# Patient Record
Sex: Male | Born: 1945 | Race: White | Hispanic: No | Smoking: Current every day smoker
Health system: Southern US, Community
[De-identification: ages and names within clinical notes are randomized; demographics above are authoritative.]

## PROBLEM LIST (undated history)

## (undated) DIAGNOSIS — J449 Chronic obstructive pulmonary disease, unspecified: Secondary | ICD-10-CM

## (undated) DIAGNOSIS — S21139A Puncture wound without foreign body of unspecified front wall of thorax without penetration into thoracic cavity, initial encounter: Secondary | ICD-10-CM

## (undated) DIAGNOSIS — I10 Essential (primary) hypertension: Secondary | ICD-10-CM

## (undated) HISTORY — DX: Essential (primary) hypertension: I10

---

## 2006-04-13 ENCOUNTER — Other Ambulatory Visit: Payer: Self-pay

## 2006-04-14 ENCOUNTER — Inpatient Hospital Stay: Payer: Self-pay | Admitting: Internal Medicine

## 2007-05-11 ENCOUNTER — Ambulatory Visit: Payer: Self-pay | Admitting: Internal Medicine

## 2008-09-14 IMAGING — US SCREENING ULTRASOUND OF ABDOMINAL AORTA
1 series · 17 of 17 positions shown · non-contrast
Comparison: none

REASON FOR EXAM: Family Hx AAA
COMMENTS:

[Series 1: screening ultrasound of abdominal aorta · 17 of 17 slices shown]
[im 1/17]
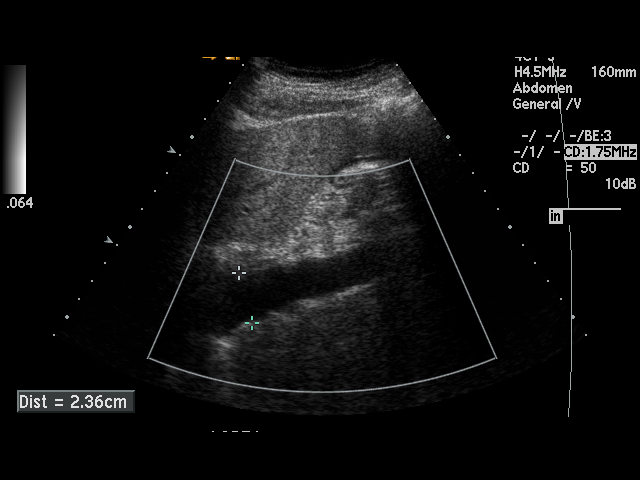
[im 2/17]
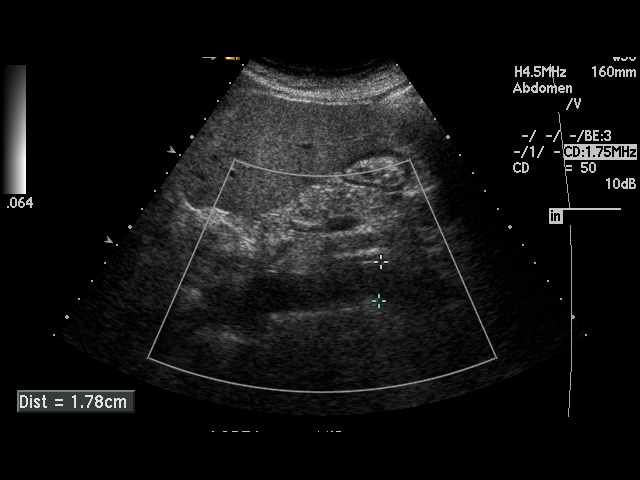
[im 3/17]
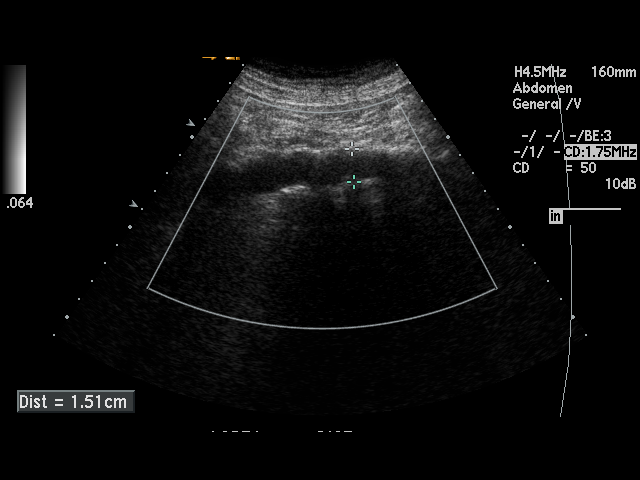
[im 4/17]
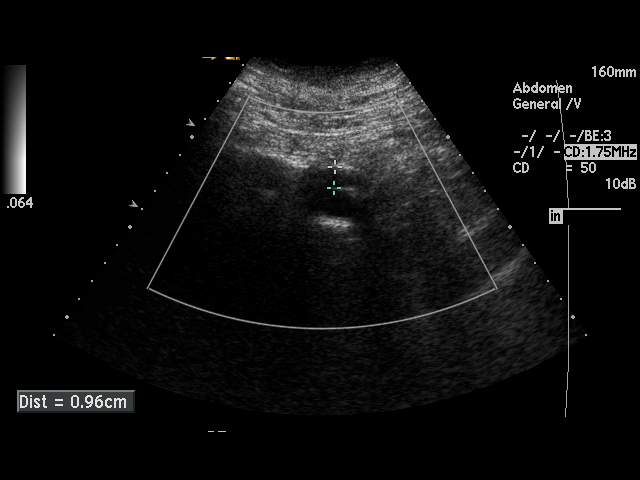
[im 5/17]
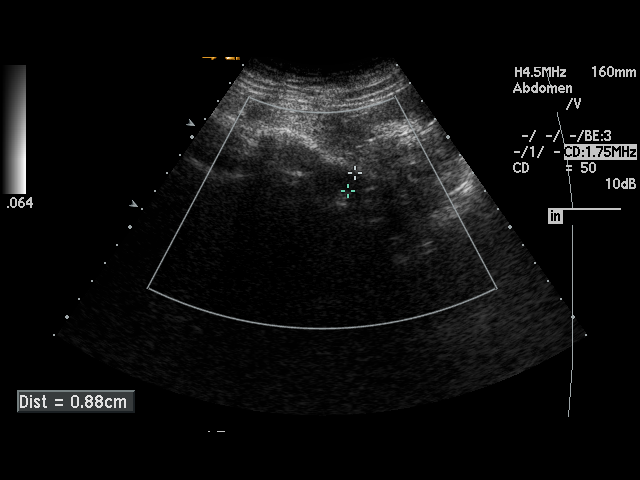
[im 6/17]
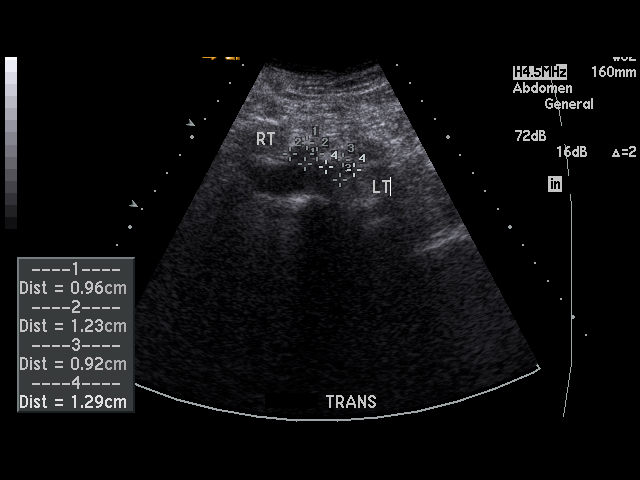
[im 7/17]
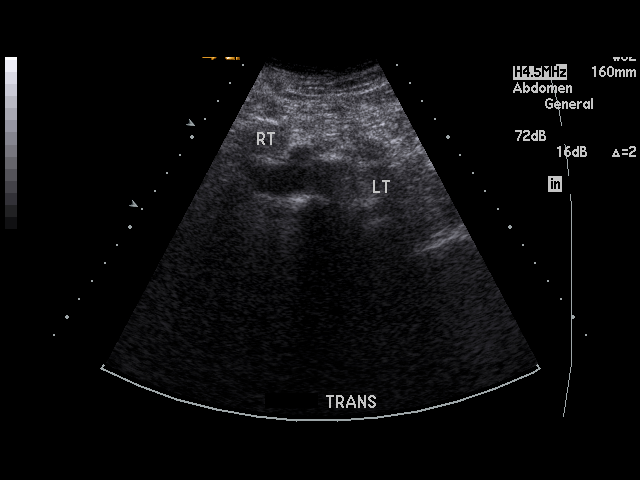
[im 8/17]
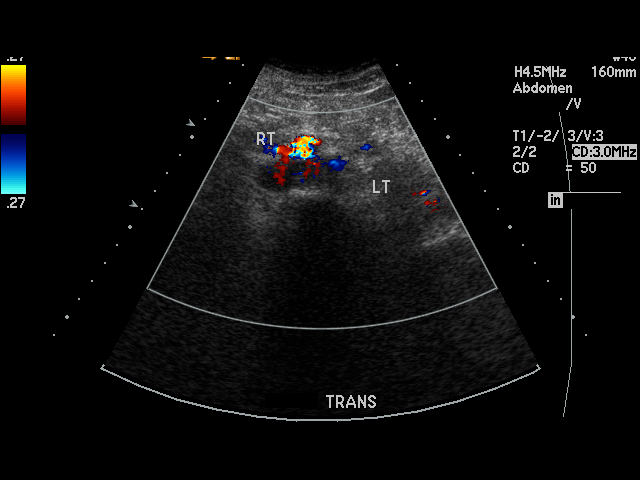
[im 9/17]
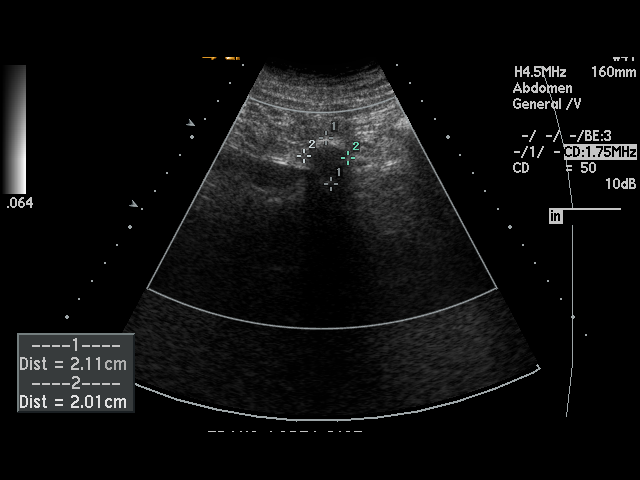
[im 10/17]
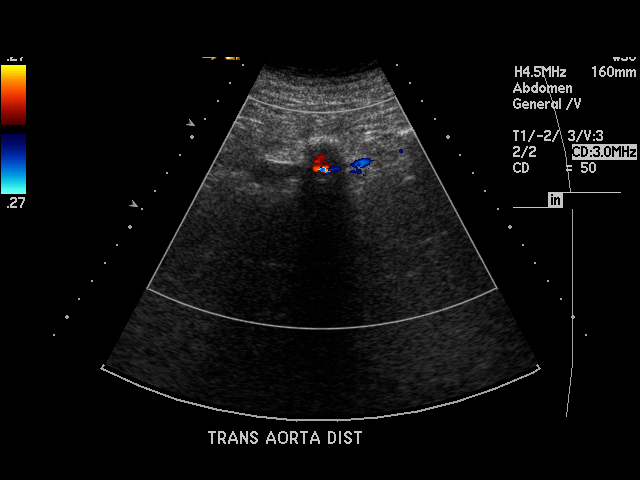
[im 11/17]
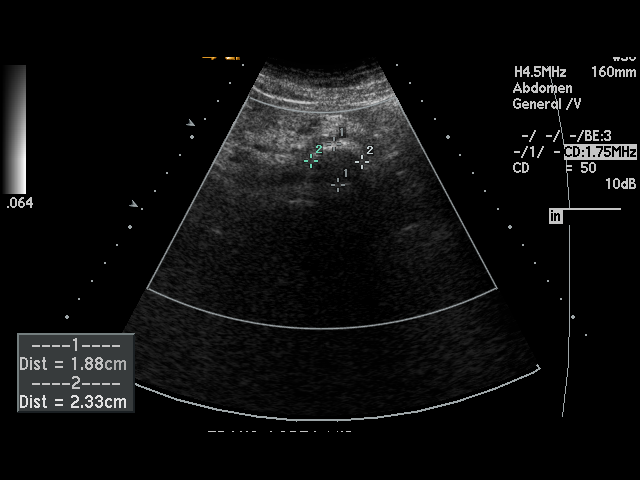
[im 12/17]
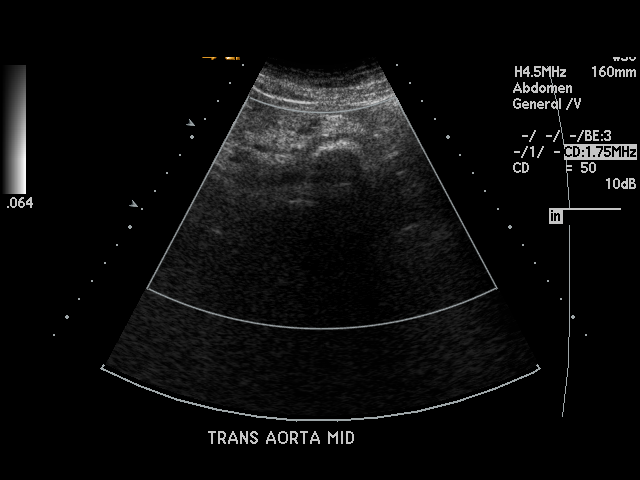
[im 13/17]
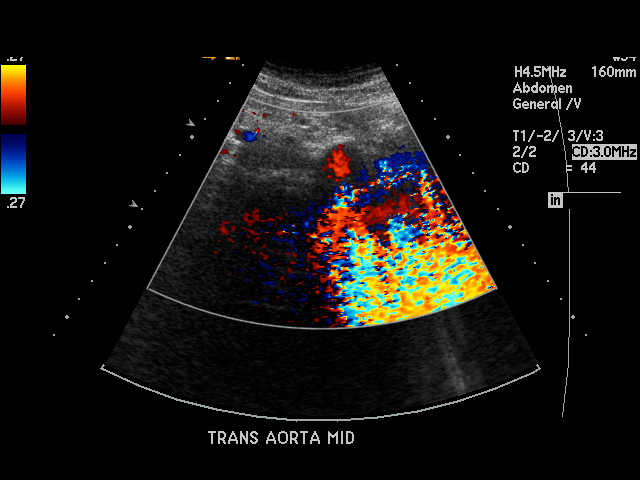
[im 14/17]
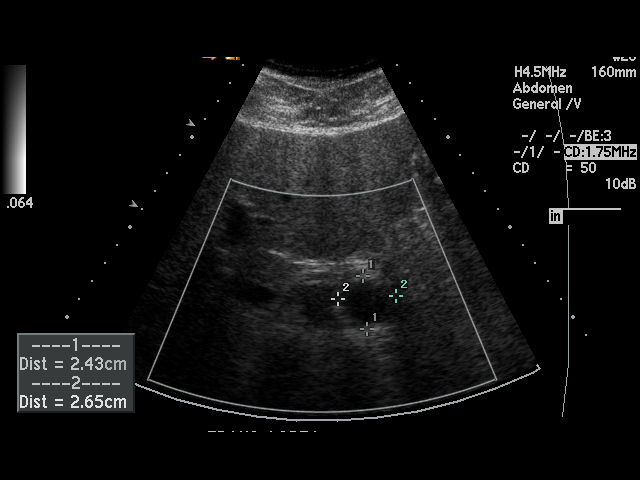
[im 15/17]
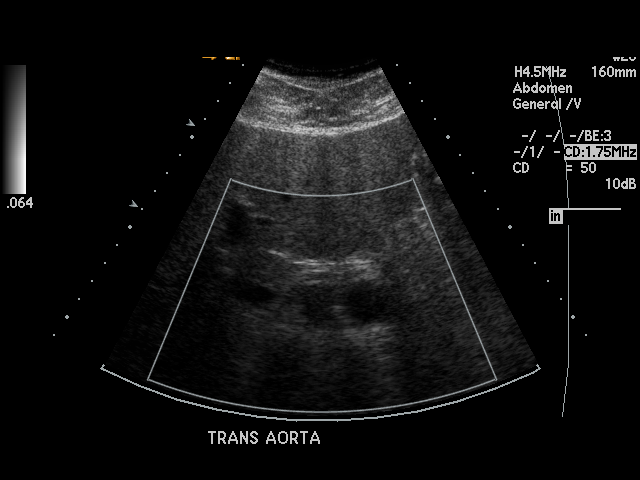
[im 16/17]
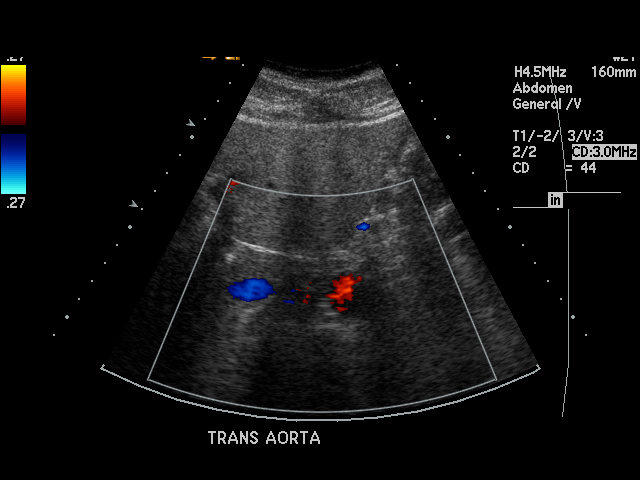
[im 17/17]
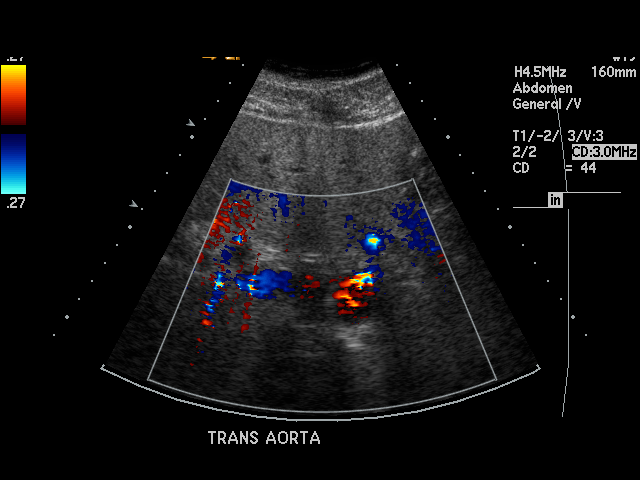

[17 of 17 positions shown; findings below may reference images not displayed]

PROCEDURE:     US  - US EXAM AAA SCREENING  - May 11, 2007  [DATE]

RESULT:     The abdominal aorta is visualized and shows no significant
abnormalities. No aneurysm formation or stenosis is seen. The common iliac
arteries are visualized and are normal in appearance. The distal abdominal
aorta measures 1.51 cm in AP diameter.
IMPRESSION: 1.     No significant abnormalities are noted.

## 2013-11-02 ENCOUNTER — Ambulatory Visit (INDEPENDENT_AMBULATORY_CARE_PROVIDER_SITE_OTHER): Payer: Medicare Other | Admitting: Podiatry

## 2013-11-02 ENCOUNTER — Encounter: Payer: Self-pay | Admitting: Podiatry

## 2013-11-02 VITALS — BP 157/87 | HR 98 | Resp 19 | Ht 70.5 in | Wt 180.0 lb

## 2013-11-02 DIAGNOSIS — B079 Viral wart, unspecified: Secondary | ICD-10-CM

## 2013-11-02 DIAGNOSIS — T691XXA Chilblains, initial encounter: Secondary | ICD-10-CM

## 2013-11-02 DIAGNOSIS — I7300 Raynaud's syndrome without gangrene: Secondary | ICD-10-CM

## 2013-11-02 NOTE — Progress Notes (Signed)
   Subjective:    Patient ID: Dennis Fuller, male    DOB: 11/26/1945, 68 y.o.   MRN: [id number]  HPI Comments: Two issue one i have this plantars wart on my left heel it has been there about a month or month and half.  The other problem which noone can figure out is my toes. Every winter they get red , blistery looking and sometimes painful. Seen two other podiatrist , my primary doctor and also been to [facility]. Its been going on about 3 years now   Toe Pain       Review of Systems  All other systems reviewed and are negative.       Objective:   Physical Exam I have reviewed his past medical history medications allergies surgeries and social history. Vital signs are stable he is alert and oriented x3. Pulses are strongly palpable. Neurologic sensorium is intact per since once the monofilament. Deep tendon reflexes are intact bilateral. Muscle strength is 5 over 5 dorsiflexors plantar flexors inverters everters all intrinsic musculature is intact. Orthopedic evaluation Mr. is all joints distal to the ankle a full range of motion without crepitus. Cutaneous evaluation demonstrates supple well hydrated cutis. The tips of the toes are multiple her as in chilblains and Raynaud's disease. 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 He has a verrucoid lesion to the plantar medial aspect of the left heel. Skin lines circumvent the lesion and thrombosed capillaries are visible.        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 Warts plantar aspect left foot.  Plan: Discussed etiology pathology conservative versus surgical therapies. Suggested that he talk to his primary care doctor about calcium channel blockers. I also suggested that he followup with me in one week after we performed his surgical curettage to the wart his left heel. This is performed with local anesthetic under sterile environment he tolerated procedure well without complication.  He was her soaking twice daily instructions were given I will followup with him in one week

## 2013-11-02 NOTE — Patient Instructions (Signed)

## 2013-11-08 ENCOUNTER — Encounter: Payer: Self-pay | Admitting: Podiatry

## 2013-11-08 ENCOUNTER — Ambulatory Visit: Payer: Self-pay | Admitting: Podiatry

## 2013-11-09 ENCOUNTER — Encounter: Payer: Self-pay | Admitting: Podiatry

## 2013-11-09 ENCOUNTER — Ambulatory Visit (INDEPENDENT_AMBULATORY_CARE_PROVIDER_SITE_OTHER): Payer: Medicare Other | Admitting: Podiatry

## 2013-11-09 VITALS — BP 158/74 | HR 103 | Resp 16 | Ht 70.0 in | Wt 175.0 lb

## 2013-11-09 DIAGNOSIS — Z9889 Other specified postprocedural states: Secondary | ICD-10-CM

## 2013-11-09 DIAGNOSIS — B079 Viral wart, unspecified: Secondary | ICD-10-CM

## 2013-11-09 NOTE — Progress Notes (Signed)
He presents today for followup of excision wart plantar medial aspect of his left heel. He denies fever chills nausea vomiting muscle aches and pains. He continues to soak in Betadine warm water.  Objective: Vital signs are stable he is alert and oriented x3 pulses to the left foot are strongly palpable. He has slight maceration around the surgical site more than likely do to keeping a Band-Aid onto long for applying too much Vaseline. He does not appear to be clinically infected at this time.  Assessment: Well-healing surgical foot left.  Plan: Discontinue Betadine in warm water. Start with Epsom salts and water. Apply very small amount of Vaseline to the surgical site and cover during the day. Leave open to dry at night. I will followup with him in the near future should he develop signs and symptoms of infection. We discussed this with him in great detail today.

## 2014-02-01 ENCOUNTER — Ambulatory Visit (INDEPENDENT_AMBULATORY_CARE_PROVIDER_SITE_OTHER): Payer: Medicare Other

## 2014-02-01 ENCOUNTER — Ambulatory Visit (INDEPENDENT_AMBULATORY_CARE_PROVIDER_SITE_OTHER): Payer: Medicare Other | Admitting: Podiatrist

## 2014-02-01 VITALS — BP 106/57 | HR 107 | Resp 16

## 2014-02-01 DIAGNOSIS — S92309A Fracture of unspecified metatarsal bone(s), unspecified foot, initial encounter for closed fracture: Secondary | ICD-10-CM

## 2014-02-01 DIAGNOSIS — M779 Enthesopathy, unspecified: Secondary | ICD-10-CM

## 2014-02-01 NOTE — Progress Notes (Signed)
   Chief Complaint  Patient presents with  . Follow-up    2 weeks ago i felt a pop in my left foot on the outer edge     HPI: Patient is 68 y.o. male who presents today for pain on the left foot. He states that 2 weeks ago he felt a pop when he went to stand up on his foot. He was wearing boots at the time and doesn't know why his foot pop.     Physical Exam GENERAL APPEARANCE: Alert, conversant. Appropriately groomed. No acute distress.  VASCULAR: Pedal pulses palpable at 2/4 DP and PT bilateral.  Capillary refill time is immediate to all digits,  Proximal to distal cooling it warm to warm.  Digital hair growth is present bilateral  NEUROLOGIC: sensation is intact epicritically and protectively to 5.07 monofilament at 5/5 sites bilateral.  Light touch is intact bilateral, vibratory sensation intact bilateral, achilles tendon reflex is intact bilateral.  MUSCULOSKELETAL: Pain allow patient on the fourth metatarsal base left is noted. Otherwise acceptable muscle strength, tone and stability bilateral.  Intrinsic muscluature intact bilateral.  Rectus appearance of foot and digits noted bilateral.   DERMATOLOGIC: skin color, texture, and turger are within normal limits.  No preulcerative lesions are seen, no interdigital maceration noted.  No open lesions present.  Digital nails are asymptomatic.   X-rays show fracture at the base of the fourth metatarsal seen on the AP view. Assessment: Fracture fourth metatarsal left  Plan: Recommended an air fracture walker and the patient would like to try his own boot as he states he has a very solid and stable hiking type boot which she would like to try first.  He was shown a boot and will pick this up if he needs it. It is not improving with issue.

## 2014-02-01 NOTE — Patient Instructions (Signed)
Metatarsal Stress Fracture A stress fracture is a break in a bone of the body that is caused by repeated stress (trauma) that slowly weakens the bone until it eventually breaks. The metatarsal bones are in the middle of the feet, connecting the toes to the ankle. They are vulnerable to stress fractures. Metatarsal stress fractures are the second most common type of stress fracture in athletes. The metatarsal of the pointer toe (second metatarsal) is the most common metatarsal to suffer a stress fracture. SYMPTOMS   Vague, spread out pain or ache. Sometimes, tenderness and swelling in the foot.  Uncommonly, bleeding and bruising in the foot.  Weakness and inability to bear weight on the injured foot.  Paleness and deformity (sometimes). CAUSES  A stress fracture is caused by repeated trauma. This slowly weakens the bone, faster than it can heal itself, until the bone breaks. Stress fractures often follow a sudden change in training schedule. Stress fractures may be related to the loss of menstrual period in women.  RISK INCREASES WITH:   Previous stress fracture.  Sudden changes in training intensity, frequency, or duration [employer] data [employer] recruits, distance runners).  Bony abnormalities (osteoporosis, tumors).  Metabolism disorders or hormone problems.  Nutrition deficiencies or eating disorders (anorexia or bulimia).  The loss of or irregular menstrual periods in women.  Poor strength and flexibility.  Running on hard surfaces. Poor leg and foot alignment. This includes flat feet.  Poor footwear with poor shock absorbers.  Poor running technique. PREVENTION   Warm up and stretch properly before activity.  Maintain physical fitness:  Muscle strength.  Endurance and flexibility.  Wear proper and correctly fitted footwear. Replace shoes after 300 to 500 miles of running.  Learn and use proper technique with training and activity.  Increase activity and training  gradually.  Treat hormonal disorders. Birth control pills can be helpful for women with menstrual period irregularity.  Correct metabolism and nutrition disorders.  Wear cushioned arch supports for runners with flat feet. PROGNOSIS  With proper treatment, stress fractures usually heal within 6 to 12 weeks. RELATED COMPLICATIONS   Failure to heal (nonunion), especially with stress fractures of the outer foot (upper part of the fifth metatarsal).  Healing in a poor position (malunion).  Recurring stress fracture.  Progression to a complete or displaced fracture.  Risks of surgery: infection, bleeding, injury to nerves (numbness, weakness, paralysis), and need for further surgery.  Repeated stress fracture, not necessarily at the same site. (Occurs in 1 of every 10 patients). TREATMENT Treatment first involves ice and medicine to reduce pain and inflammation. You must rest from any aggravating activity, to avoid making the fracture worse. For severe stress fractures, crutches may be advised, to take weight off the injured foot. Depending on your caregiver's instructions, you may be permitted to perform activities that do not cause pain. Any menstrual, hormonal, or nutritional problems must be addressed and treated. Return to activity must be performed gradually, to avoid reinjuring the foot. Physical therapy may be advised, to help strengthen the foot and regain full function. On rare occasions, surgery is needed. This may be offered if non-surgical treatment is ineffective after 3 to 6 months. MEDICATION   If pain medicine is needed, nonsteroidal anti-inflammatory medicines (NSAIDS) or other minor pain relievers are often advised.  Do not take pain medicine for 7 days before surgery.  Only take over-the-counter or prescription medicines for pain, discomfort, or fever as directed by your caregiver. SEEK IMMEDIATE MEDICAL CARE IF:  Symptoms get  worse or do not improve in 2 weeks, despite  treatment. The following occur after immobilization or surgery:  Swelling above or below the fracture site.  Severe, persistent pain.  Blue or gray skin below the fracture site, especially under the toenails. Numbness or loss of feeling below the fracture site.  New, unexplained symptoms develop. (Drugs used in treatment may produce side effects.) Document Released: 08/24/2005 Document Revised: 11/16/2011 Document Reviewed: 12/06/2008 [facility] Patient Information 2014 [place], [place].

## 2015-09-11 DIAGNOSIS — M9903 Segmental and somatic dysfunction of lumbar region: Secondary | ICD-10-CM | POA: Diagnosis not present

## 2015-09-11 DIAGNOSIS — M5136 Other intervertebral disc degeneration, lumbar region: Secondary | ICD-10-CM | POA: Diagnosis not present

## 2015-09-11 DIAGNOSIS — M9905 Segmental and somatic dysfunction of pelvic region: Secondary | ICD-10-CM | POA: Diagnosis not present

## 2015-09-11 DIAGNOSIS — M546 Pain in thoracic spine: Secondary | ICD-10-CM | POA: Diagnosis not present

## 2015-09-11 DIAGNOSIS — M9902 Segmental and somatic dysfunction of thoracic region: Secondary | ICD-10-CM | POA: Diagnosis not present

## 2015-09-11 DIAGNOSIS — M955 Acquired deformity of pelvis: Secondary | ICD-10-CM | POA: Diagnosis not present

## 2017-09-28 DIAGNOSIS — L853 Xerosis cutis: Secondary | ICD-10-CM | POA: Diagnosis not present

## 2018-12-29 DIAGNOSIS — R590 Localized enlarged lymph nodes: Secondary | ICD-10-CM | POA: Diagnosis not present

## 2022-05-25 ENCOUNTER — Other Ambulatory Visit: Payer: Self-pay | Admitting: Surgery

## 2022-05-25 DIAGNOSIS — M19011 Primary osteoarthritis, right shoulder: Secondary | ICD-10-CM

## 2022-06-02 ENCOUNTER — Ambulatory Visit
Admission: RE | Admit: 2022-06-02 | Discharge: 2022-06-02 | Disposition: A | Discharge status: Home / Self Care | Payer: Medicare Other | Source: Ambulatory Visit | Attending: Surgery | Admitting: Surgery

## 2022-06-02 DIAGNOSIS — M19011 Primary osteoarthritis, right shoulder: Secondary | ICD-10-CM | POA: Diagnosis not present

## 2023-03-25 ENCOUNTER — Other Ambulatory Visit: Payer: Self-pay | Admitting: Surgery

## 2023-03-25 DIAGNOSIS — M19011 Primary osteoarthritis, right shoulder: Secondary | ICD-10-CM

## 2023-03-25 DIAGNOSIS — M7581 Other shoulder lesions, right shoulder: Secondary | ICD-10-CM

## 2023-04-07 ENCOUNTER — Ambulatory Visit
Admission: RE | Admit: 2023-04-07 | Discharge: 2023-04-07 | Disposition: A | Discharge status: Home / Self Care | Payer: Medicare Other | Source: Ambulatory Visit | Attending: Surgery | Admitting: Surgery

## 2023-04-07 DIAGNOSIS — M19011 Primary osteoarthritis, right shoulder: Secondary | ICD-10-CM | POA: Diagnosis present

## 2023-04-07 DIAGNOSIS — M7581 Other shoulder lesions, right shoulder: Secondary | ICD-10-CM | POA: Insufficient documentation

## 2023-05-13 ENCOUNTER — Other Ambulatory Visit: Payer: Self-pay | Admitting: Surgery

## 2023-05-14 ENCOUNTER — Encounter
Admission: RE | Admit: 2023-05-14 | Discharge: 2023-05-14 | Disposition: A | Discharge status: Home / Self Care | Payer: No Typology Code available for payment source | Source: Ambulatory Visit | Attending: Surgery | Admitting: Surgery

## 2023-05-14 ENCOUNTER — Other Ambulatory Visit: Payer: Self-pay

## 2023-05-14 VITALS — BP 120/60 | HR 86 | Resp 16 | Ht 69.0 in | Wt 156.0 lb

## 2023-05-14 DIAGNOSIS — Z01810 Encounter for preprocedural cardiovascular examination: Secondary | ICD-10-CM | POA: Diagnosis not present

## 2023-05-14 DIAGNOSIS — Z01818 Encounter for other preprocedural examination: Secondary | ICD-10-CM | POA: Diagnosis present

## 2023-05-14 HISTORY — DX: Chronic obstructive pulmonary disease, unspecified: J44.9

## 2023-05-14 LAB — COMPREHENSIVE METABOLIC PANEL
ALT: 17 U/L (ref 0–44)
AST: 21 U/L (ref 15–41)
Albumin: 4.5 g/dL (ref 3.5–5.0)
Alkaline Phosphatase: 43 U/L (ref 38–126)
Anion gap: 11 (ref 5–15)
BUN: 15 mg/dL (ref 8–23)
CO2: 24 mmol/L (ref 22–32)
Calcium: 9.2 mg/dL (ref 8.9–10.3)
Chloride: 93 mmol/L — ABNORMAL LOW (ref 98–111)
Creatinine, Ser: 0.8 mg/dL (ref 0.61–1.24)
GFR, Estimated: >60 mL/min (ref >60)
Glucose, Bld: 101 mg/dL — ABNORMAL HIGH (ref 70–99)
Potassium: 4 mmol/L (ref 3.5–5.1)
Sodium: 128 mmol/L — ABNORMAL LOW (ref 135–145)
Total Bilirubin: 0.7 mg/dL (ref 0.3–1.2)
Total Protein: 7.5 g/dL (ref 6.5–8.1)

## 2023-05-14 LAB — CBC WITH DIFFERENTIAL/PLATELET
Abs Immature Granulocytes: 0.05 10*3/uL (ref 0.00–0.07)
Basophils Absolute: 0 10*3/uL (ref 0.0–0.1)
Basophils Relative: 0 %
Eosinophils Absolute: 0.2 10*3/uL (ref 0.0–0.5)
Eosinophils Relative: 2 %
HCT: 38.2 % — ABNORMAL LOW (ref 39.0–52.0)
Hemoglobin: 13.7 g/dL (ref 13.0–17.0)
Immature Granulocytes: 1 %
Lymphocytes Relative: 24 %
Lymphs Abs: 1.9 10*3/uL (ref 0.7–4.0)
MCH: 28 pg (ref 26.0–34.0)
MCHC: 35.9 g/dL (ref 30.0–36.0)
MCV: 78 fL — ABNORMAL LOW (ref 80.0–100.0)
Monocytes Absolute: 0.9 10*3/uL (ref 0.1–1.0)
Monocytes Relative: 11 %
Neutro Abs: 5.1 10*3/uL (ref 1.7–7.7)
Neutrophils Relative %: 62 %
Platelets: 277 10*3/uL (ref 150–400)
RBC: 4.9 MIL/uL (ref 4.22–5.81)
RDW: 15.2 % (ref 11.5–15.5)
WBC: 8.2 10*3/uL (ref 4.0–10.5)
nRBC: 0 % (ref 0.0–0.2)

## 2023-05-14 LAB — URINALYSIS, ROUTINE W REFLEX MICROSCOPIC
Bilirubin Urine: NEGATIVE
Glucose, UA: NEGATIVE mg/dL
Hgb urine dipstick: NEGATIVE
Ketones, ur: NEGATIVE mg/dL
Leukocytes,Ua: NEGATIVE
Nitrite: NEGATIVE
Protein, ur: NEGATIVE mg/dL
Specific Gravity, Urine: 1.008 (ref 1.005–1.030)
pH: 6 (ref 5.0–8.0)

## 2023-05-14 LAB — SURGICAL PCR SCREEN
MRSA, PCR: NEGATIVE
Staphylococcus aureus: NEGATIVE

## 2023-05-14 LAB — TYPE AND SCREEN
ABO/RH(D): A POS
Antibody Screen: NEGATIVE

## 2023-05-14 NOTE — Patient Instructions (Addendum)
Your procedure is scheduled on: Thursday 05/20/23 To find out your arrival time, please call [phone] between 1PM - 3PM on:   Wednesday 05/19/23 Report to the Registration Desk on the 1st floor of the [facility]. FREE Valet parking is available.  If your arrival time is 6:00 am, do not arrive before that time as the [facility] entrance doors do not open until 6:00 am.  REMEMBER: Instructions that are not followed completely may result in serious medical risk, up to and including death; or upon the discretion of your surgeon and anesthesiologist your surgery may need to be rescheduled.  Do not eat food after midnight the night before surgery.  No gum chewing or hard candies.  You may however, drink CLEAR liquids up to 2 hours before you are scheduled to arrive for your surgery. Do not drink anything within 2 hours of your scheduled arrival time.  Clear liquids include: - water  - apple juice without pulp - gatorade (not RED colors) - black coffee or tea (Do NOT add milk or creamers to the coffee or tea) Do NOT drink anything that is not on this list.  Type 1 and Type 2 diabetics should only drink water.  In addition, your doctor has ordered for you to drink the provided:  Ensure Pre-Surgery Clear Carbohydrate Drink  Drinking this carbohydrate drink up to two hours before surgery helps to reduce insulin resistance and improve patient outcomes. Please complete drinking 2 hours before scheduled arrival time.  One week prior to surgery: Stop Anti-inflammatories (NSAIDS) such as Advil and Advil PM, Aleve, Ibuprofen, Motrin, Naproxen, Naprosyn and Aspirin based products such as Excedrin, Goody's Powder, BC Powder. You may however, continue to take Tylenol if needed for pain up until the day of surgery.  Stop ANY OVER THE COUNTER supplements until after surgery. Folic acid, Multivitamin, Vitamin D  Do not stop your prescription medications  TAKE ONLY THESE MEDICATIONS THE MORNING  OF SURGERY WITH A SIP OF WATER:  atorvastatin (LIPITOR) 20 MG tablet  NIFEdipine (PROCARDIA XL/NIFEDICAL XL) 60 MG 24 hr tablet   Use inhalers on the day of surgery and bring to the hospital.  No Alcohol for 24 hours before or after surgery.  No Smoking including e-cigarettes for 24 hours before surgery.  No chewable tobacco products for at least 6 hours before surgery.  No nicotine patches on the day of surgery.  Do not use any "recreational" drugs for at least a week (preferably 2 weeks) before your surgery.  Please be advised that the combination of cocaine and anesthesia may have negative outcomes, up to and including death. If you test positive for cocaine, your surgery will be cancelled.  On the morning of surgery brush your teeth with toothpaste and water, you may rinse your mouth with mouthwash if you wish. Do not swallow any toothpaste or mouthwash.  Use CHG Soap or wipes as directed on instruction sheet. Shower daily for 5 days starting Sunday 05/16/23  Do not wear lotions, powders, or perfumes.   Do not shave body hair from the neck down 48 hours before surgery.  Wear comfortable clothing (specific to your surgery type) to the hospital.  Do not wear jewelry, make-up, hairpins, clips or nail polish.  Contact lenses, hearing aids and dentures may not be worn into surgery.  Do not bring valuables to the hospital. [facility] is not responsible for any missing/lost belongings or valuables.   Total Shoulder Arthroplasty:  use Benzoyl Peroxide 5% Gel  as directed on instruction sheet.  Notify your doctor if there is any change in your medical condition (cold, fever, infection).  If you are being discharged the day of surgery, you will not be allowed to drive home. You will need a responsible individual to drive you home and stay with you for 24 hours after surgery.   If you are taking public transportation, you will need to have a responsible individual with you.  If you  are being admitted to the hospital overnight, leave your suitcase in the car. After surgery it may be brought to your room.  In case of increased patient census, it may be necessary for you, the patient, to continue your postoperative care in the Same Day Surgery department.  After surgery, you can help prevent lung complications by doing breathing exercises.  Take deep breaths and cough every 1-2 hours. Your doctor may order a device called an Incentive Spirometer to help you take deep breaths. When coughing or sneezing, hold a pillow firmly against your incision with both hands. This is called "splinting." Doing this helps protect your incision. It also decreases belly discomfort.  Surgery Visitation Policy:  Patients undergoing a surgery or procedure may have two family members or support persons with them as long as the person is not COVID-19 positive or experiencing its symptoms.   Inpatient Visitation:    Visiting hours are 7 a.m. to 8 p.m. Up to four visitors are allowed at one time in a patient room. The visitors may rotate out with other people during the day. One designated support person (adult) may remain overnight.  Please call the Pre-admissions Testing Dept. at [phone] if you have any questions about these instructions.    Pre-operative 5 CHG Bath Instructions   You can play a key role in reducing the risk of infection after surgery. Your skin needs to be as free of germs as possible. You can reduce the number of germs on your skin by washing with CHG (chlorhexidine gluconate) soap before surgery. CHG is an antiseptic soap that kills germs and continues to kill germs even after washing.   DO NOT use if you have an allergy to chlorhexidine/CHG or antibacterial soaps. If your skin becomes reddened or irritated, stop using the CHG and notify one of our RNs at [phone].   Please shower with the CHG soap starting 4 days before surgery using the following schedule:      Please keep in mind the following:  DO NOT shave, including legs and underarms, starting the day of your first shower.   You may shave your face at any point before/day of surgery.  Place clean sheets on your bed the day you start using CHG soap. Use a clean washcloth (not used since being washed) for each shower. DO NOT sleep with pets once you start using the CHG.   CHG Shower Instructions:  If you choose to wash your hair and private area, wash first with your normal shampoo/soap.  After you use shampoo/soap, rinse your hair and body thoroughly to remove shampoo/soap residue.  Turn the water OFF and apply about 3 tablespoons (45 ml) of CHG soap to a CLEAN washcloth.  Apply CHG soap ONLY FROM YOUR NECK DOWN TO YOUR TOES (washing for 3-5 minutes)  DO NOT use CHG soap on face, private areas, open wounds, or sores.  Pay special attention to the area where your surgery is being performed.  If you are having back surgery, having someone wash  your back for you may be helpful. Wait 2 minutes after CHG soap is applied, then you may rinse off the CHG soap.  Pat dry with a clean towel  Put on clean clothes/pajamas   If you choose to wear lotion, please use ONLY the CHG-compatible lotions on the back of this paper.     Additional instructions for the day of surgery: DO NOT APPLY any lotions, deodorants, cologne, or perfumes.   Put on clean/comfortable clothes.  Brush your teeth.  Ask your nurse before applying any prescription medications to the skin.      CHG Compatible Lotions   Aveeno Moisturizing lotion  Cetaphil Moisturizing Cream  Cetaphil Moisturizing Lotion  Clairol Herbal Essence Moisturizing Lotion, Dry Skin  Clairol Herbal Essence Moisturizing Lotion, Extra Dry Skin  Clairol Herbal Essence Moisturizing Lotion, Normal Skin  Curel Age Defying Therapeutic Moisturizing Lotion with Alpha Hydroxy  Curel Extreme Care Body Lotion  Curel Soothing Hands Moisturizing Hand  Lotion  Curel Therapeutic Moisturizing Cream, Fragrance-Free  Curel Therapeutic Moisturizing Lotion, Fragrance-Free  Curel Therapeutic Moisturizing Lotion, Original Formula  Eucerin Daily Replenishing Lotion  Eucerin Dry Skin Therapy Plus Alpha Hydroxy Crme  Eucerin Dry Skin Therapy Plus Alpha Hydroxy Lotion  Eucerin Original Crme  Eucerin Original Lotion  Eucerin Plus Crme Eucerin Plus Lotion  Eucerin TriLipid Replenishing Lotion  Keri Anti-Bacterial Hand Lotion  Keri Deep Conditioning Original Lotion Dry Skin Formula Softly Scented  Keri Deep Conditioning Original Lotion, Fragrance Free Sensitive Skin Formula  Keri Lotion Fast Absorbing Fragrance Free Sensitive Skin Formula  Keri Lotion Fast Absorbing Softly Scented Dry Skin Formula  Keri Original Lotion  Keri Skin Renewal Lotion Keri Silky Smooth Lotion  Keri Silky Smooth Sensitive Skin Lotion  Nivea Body Creamy Conditioning Oil  Nivea Body Extra Enriched Lotion  Nivea Body Original Lotion  Nivea Body Sheer Moisturizing Lotion Nivea Crme  Nivea Skin Firming Lotion  NutraDerm 30 Skin Lotion  NutraDerm Skin Lotion  NutraDerm Therapeutic Skin Cream  NutraDerm Therapeutic Skin Lotion  ProShield Protective Hand Cream  Provon moisturizing lotion  Preparing for Total Shoulder Arthroplasty  Before surgery, you can play an important role by reducing the number of germs on your skin by using the following products:  Benzoyl Peroxide Gel  o Reduces the number of germs present on the skin  o Applied twice a day to shoulder area starting two days before surgery  Chlorhexidine Gluconate (CHG) Soap  o An antiseptic cleaner that kills germs and bonds with the skin to continue killing germs even after washing  o Used for showering the night before surgery and morning of surgery  BENZOYL PEROXIDE 5% GEL  Please do not use if you have an allergy to benzoyl peroxide. If your skin becomes reddened/irritated stop using the benzoyl  peroxide.  Starting two days before surgery, apply as follows:  1. Apply benzoyl peroxide in the morning and at night. Apply after taking a shower. If you are not taking a shower, clean entire shoulder front, back, and side along with the armpit with a clean wet washcloth.  2. Place a quarter-sized dollop on your shoulder and rub in thoroughly, making sure to cover the front, back, and side of your shoulder, along with the armpit.  2 days before ____ AM ____ PM 1 day before ____ AM ____ PM  Tuesday and Wednesday  3. Do this twice a day for two days. (Last application is the night before surgery, AFTER using the CHG soap).  4. Do  NOT apply benzoyl peroxide gel on the day of surgery.  How to Use an Incentive Spirometer  An incentive spirometer is a tool that measures how well you are filling your lungs with each breath. Learning to take long, deep breaths using this tool can help you keep your lungs clear and active. This may help to reverse or lessen your chance of developing breathing (pulmonary) problems, especially infection. You may be asked to use a spirometer: After a surgery. If you have a lung problem or a history of smoking. After a long period of time when you have been unable to move or be active. If the spirometer includes an indicator to show the highest number that you have reached, your health care provider or respiratory therapist will help you set a goal. Keep a log of your progress as told by your health care provider. What are the risks? Breathing too quickly may cause dizziness or cause you to pass out. Take your time so you do not get dizzy or light-headed. If you are in pain, you may need to take pain medicine before doing incentive spirometry. It is harder to take a deep breath if you are having pain. How to use your incentive spirometer  Sit up on the edge of your bed or on a chair. Hold the incentive spirometer so that it is in an upright position. Before you use  the spirometer, breathe out normally. Place the mouthpiece in your mouth. Make sure your lips are closed tightly around it. Breathe in slowly and as deeply as you can through your mouth, causing the piston or the ball to rise toward the top of the chamber. Hold your breath for 3-5 seconds, or for as long as possible. If the spirometer includes a coach indicator, use this to guide you in breathing. Slow down your breathing if the indicator goes above the marked areas. Remove the mouthpiece from your mouth and breathe out normally. The piston or ball will return to the bottom of the chamber. Rest for a few seconds, then repeat the steps 10 or more times. Take your time and take a few normal breaths between deep breaths so that you do not get dizzy or light-headed. Do this every 1-2 hours when you are awake. If the spirometer includes a goal marker to show the highest number you have reached (best effort), use this as a goal to work toward during each repetition. After each set of 10 deep breaths, cough a few times. This will help to make sure that your lungs are clear. If you have an incision on your chest or abdomen from surgery, place a pillow or a rolled-up towel firmly against the incision when you cough. This can help to reduce pain while taking deep breaths and coughing. General tips When you are able to get out of bed: Walk around often. Continue to take deep breaths and cough in order to clear your lungs. Keep using the incentive spirometer until your health care provider says it is okay to stop using it. If you have been in the hospital, you may be told to keep using the spirometer at home. Contact a health care provider if: You are having difficulty using the spirometer. You have trouble using the spirometer as often as instructed. Your pain medicine is not giving enough relief for you to use the spirometer as told. You have a fever. Get help right away if: You develop shortness of  breath. You develop a cough with bloody  mucus from the lungs. You have fluid or blood coming from an incision site after you cough. Summary An incentive spirometer is a tool that can help you learn to take long, deep breaths to keep your lungs clear and active. You may be asked to use a spirometer after a surgery, if you have a lung problem or a history of smoking, or if you have been inactive for a long period of time. Use your incentive spirometer as instructed every 1-2 hours while you are awake. If you have an incision on your chest or abdomen, place a pillow or a rolled-up towel firmly against your incision when you cough. This will help to reduce pain. Get help right away if you have shortness of breath, you cough up bloody mucus, or blood comes from your incision when you cough. This information is not intended to replace advice given to you by your health care provider. Make sure you discuss any questions you have with your health care provider. Document Revised: 11/13/2019 Document Reviewed: 11/13/2019 Elsevier Patient Education  2023 [employer]    Preoperative Educational Videos for Total Hip, Knee and Shoulder Replacements  To better prepare for surgery, please view our videos that explain the physical activity and discharge planning required to have the best surgical recovery at [facility]  [email or web address]  Questions? Call [phone] or email [email or web address]

## 2023-05-19 MED ORDER — CHLORHEXIDINE GLUCONATE 0.12 % MT SOLN
15.0000 mL | Freq: Once | OROMUCOSAL | Status: AC
  Administered 2023-05-20: 15 mL via OROMUCOSAL

## 2023-05-19 MED ORDER — FAMOTIDINE 20 MG PO TABS
20.0000 mg | ORAL_TABLET | Freq: Once | ORAL | Status: AC
  Administered 2023-05-20: 20 mg via ORAL

## 2023-05-19 MED ORDER — CEFAZOLIN SODIUM-DEXTROSE 2-4 GM/100ML-% IV SOLN
2.0000 g | INTRAVENOUS | Status: AC
  Administered 2023-05-20: 2 g via INTRAVENOUS

## 2023-05-19 MED ORDER — TRANEXAMIC ACID-NACL 1000-0.7 MG/100ML-% IV SOLN
1000.0000 mg | INTRAVENOUS | Status: AC
  Administered 2023-05-20: 1000 mg via INTRAVENOUS

## 2023-05-19 MED ORDER — ORAL CARE MOUTH RINSE: 15.0000 mL | Freq: Once | OROMUCOSAL | Status: AC

## 2023-05-19 MED ORDER — LACTATED RINGERS IV SOLN: INTRAVENOUS | Status: DC

## 2023-05-20 ENCOUNTER — Ambulatory Visit: Payer: No Typology Code available for payment source

## 2023-05-20 ENCOUNTER — Other Ambulatory Visit: Payer: Self-pay

## 2023-05-20 ENCOUNTER — Encounter
Admission: RE | Disposition: A | Discharge status: Home / Self Care | Payer: Self-pay | Source: Home / Self Care | Attending: Surgery

## 2023-05-20 ENCOUNTER — Ambulatory Visit: Payer: No Typology Code available for payment source | Admitting: Certified Registered"

## 2023-05-20 ENCOUNTER — Ambulatory Visit
Admission: RE | Admit: 2023-05-20 | Discharge: 2023-05-20 | Disposition: A | Discharge status: Home / Self Care | Payer: No Typology Code available for payment source | Attending: Surgery | Admitting: Surgery

## 2023-05-20 ENCOUNTER — Encounter: Payer: Self-pay | Admitting: Surgery

## 2023-05-20 ENCOUNTER — Ambulatory Visit: Payer: No Typology Code available for payment source | Admitting: Urgent Care

## 2023-05-20 DIAGNOSIS — J449 Chronic obstructive pulmonary disease, unspecified: Secondary | ICD-10-CM | POA: Insufficient documentation

## 2023-05-20 DIAGNOSIS — Z79899 Other long term (current) drug therapy: Secondary | ICD-10-CM | POA: Insufficient documentation

## 2023-05-20 DIAGNOSIS — M19011 Primary osteoarthritis, right shoulder: Secondary | ICD-10-CM | POA: Diagnosis present

## 2023-05-20 DIAGNOSIS — F17210 Nicotine dependence, cigarettes, uncomplicated: Secondary | ICD-10-CM | POA: Insufficient documentation

## 2023-05-20 DIAGNOSIS — I10 Essential (primary) hypertension: Secondary | ICD-10-CM | POA: Insufficient documentation

## 2023-05-20 HISTORY — PX: REVERSE SHOULDER ARTHROPLASTY: SHX5054

## 2023-05-20 LAB — ABO/RH: ABO/RH(D): A POS

## 2023-05-20 SURGERY — ARTHROPLASTY, SHOULDER, TOTAL, REVERSE
Anesthesia: General | Site: Shoulder | Laterality: Right

## 2023-05-20 MED ORDER — FAMOTIDINE 20 MG PO TABS
ORAL_TABLET | ORAL | Status: AC
  Filled 2023-05-20: qty 1

## 2023-05-20 MED ORDER — OXYCODONE HCL 5 MG PO TABS: 5.0000 mg | ORAL_TABLET | ORAL | Status: DC | PRN

## 2023-05-20 MED ORDER — ONDANSETRON HCL 4 MG/2ML IJ SOLN
INTRAMUSCULAR | Status: AC
  Filled 2023-05-20: qty 2

## 2023-05-20 MED ORDER — EPHEDRINE 5 MG/ML INJ
INTRAVENOUS | Status: AC
  Filled 2023-05-20: qty 5

## 2023-05-20 MED ORDER — FENTANYL CITRATE (PF) 100 MCG/2ML IJ SOLN: 25.0000 ug | INTRAMUSCULAR | Status: DC | PRN

## 2023-05-20 MED ORDER — CEFAZOLIN SODIUM-DEXTROSE 2-4 GM/100ML-% IV SOLN
INTRAVENOUS | Status: AC
  Filled 2023-05-20: qty 100

## 2023-05-20 MED ORDER — CHLORHEXIDINE GLUCONATE 0.12 % MT SOLN
OROMUCOSAL | Status: AC
  Filled 2023-05-20: qty 15

## 2023-05-20 MED ORDER — SODIUM CHLORIDE 0.9 % IR SOLN
Status: DC | PRN
  Administered 2023-05-20: 3000 mL

## 2023-05-20 MED ORDER — ROCURONIUM BROMIDE 100 MG/10ML IV SOLN
INTRAVENOUS | Status: DC | PRN
  Administered 2023-05-20: 40 mg via INTRAVENOUS

## 2023-05-20 MED ORDER — LIDOCAINE HCL (PF) 1 % IJ SOLN
INTRAMUSCULAR | Status: DC | PRN
Start: 2023-05-20 — End: 2023-05-20
  Administered 2023-05-20: 2 mL via SUBCUTANEOUS

## 2023-05-20 MED ORDER — ONDANSETRON HCL 4 MG PO TABS: 4.0000 mg | ORAL_TABLET | Freq: Four times a day (QID) | ORAL | Status: DC | PRN

## 2023-05-20 MED ORDER — GLYCOPYRROLATE 0.2 MG/ML IJ SOLN
INTRAMUSCULAR | Status: DC | PRN
  Administered 2023-05-20: .2 mg via INTRAVENOUS

## 2023-05-20 MED ORDER — OXYCODONE HCL 5 MG PO TABS
5.0000 mg | ORAL_TABLET | ORAL | 0 refills | Status: DC | PRN
Start: 2023-05-20 — End: 2024-05-22

## 2023-05-20 MED ORDER — METOCLOPRAMIDE HCL 10 MG PO TABS: 5.0000 mg | ORAL_TABLET | Freq: Three times a day (TID) | ORAL | Status: DC | PRN

## 2023-05-20 MED ORDER — 0.9 % SODIUM CHLORIDE (POUR BTL) OPTIME
TOPICAL | Status: DC | PRN
  Administered 2023-05-20: 500 mL

## 2023-05-20 MED ORDER — LIDOCAINE HCL (PF) 1 % IJ SOLN
INTRAMUSCULAR | Status: AC
  Filled 2023-05-20: qty 5

## 2023-05-20 MED ORDER — ACETAMINOPHEN 10 MG/ML IV SOLN: 1000.0000 mg | Freq: Once | INTRAVENOUS | Status: DC | PRN

## 2023-05-20 MED ORDER — BUPIVACAINE HCL (PF) 0.5 % IJ SOLN
INTRAMUSCULAR | Status: AC
  Filled 2023-05-20: qty 10

## 2023-05-20 MED ORDER — DEXAMETHASONE SODIUM PHOSPHATE 10 MG/ML IJ SOLN
INTRAMUSCULAR | Status: AC
  Filled 2023-05-20: qty 1

## 2023-05-20 MED ORDER — BUPIVACAINE LIPOSOME 1.3 % IJ SUSP
INTRAMUSCULAR | Status: AC
  Filled 2023-05-20: qty 20

## 2023-05-20 MED ORDER — ACETAMINOPHEN 325 MG PO TABS: 325.0000 mg | ORAL_TABLET | Freq: Four times a day (QID) | ORAL | Status: DC | PRN

## 2023-05-20 MED ORDER — ONDANSETRON HCL 4 MG/2ML IJ SOLN: 4.0000 mg | Freq: Four times a day (QID) | INTRAMUSCULAR | Status: DC | PRN

## 2023-05-20 MED ORDER — OXYCODONE HCL 5 MG PO TABS: 5.0000 mg | ORAL_TABLET | Freq: Once | ORAL | Status: DC | PRN

## 2023-05-20 MED ORDER — MIDAZOLAM HCL 2 MG/2ML IJ SOLN
INTRAMUSCULAR | Status: AC
  Filled 2023-05-20: qty 2

## 2023-05-20 MED ORDER — OXYCODONE HCL 5 MG/5ML PO SOLN: 5.0000 mg | Freq: Once | ORAL | Status: DC | PRN

## 2023-05-20 MED ORDER — PROMETHAZINE HCL 25 MG/ML IJ SOLN: 6.2500 mg | INTRAMUSCULAR | Status: DC | PRN

## 2023-05-20 MED ORDER — DROPERIDOL 2.5 MG/ML IJ SOLN: 0.6250 mg | Freq: Once | INTRAMUSCULAR | Status: DC | PRN

## 2023-05-20 MED ORDER — MIDAZOLAM HCL 2 MG/2ML IJ SOLN
1.0000 mg | Freq: Once | INTRAMUSCULAR | Status: AC
  Administered 2023-05-20: 1 mg via INTRAVENOUS

## 2023-05-20 MED ORDER — GLYCOPYRROLATE 0.2 MG/ML IJ SOLN
INTRAMUSCULAR | Status: AC
  Filled 2023-05-20: qty 1

## 2023-05-20 MED ORDER — EPINEPHRINE PF 1 MG/ML IJ SOLN
INTRAMUSCULAR | Status: AC
  Filled 2023-05-20: qty 1

## 2023-05-20 MED ORDER — FENTANYL CITRATE (PF) 100 MCG/2ML IJ SOLN
INTRAMUSCULAR | Status: DC | PRN
  Administered 2023-05-20 (×2): 50 ug via INTRAVENOUS

## 2023-05-20 MED ORDER — KETOROLAC TROMETHAMINE 15 MG/ML IJ SOLN
INTRAMUSCULAR | Status: AC
  Filled 2023-05-20: qty 1

## 2023-05-20 MED ORDER — PROPOFOL 10 MG/ML IV BOLUS
INTRAVENOUS | Status: DC | PRN
  Administered 2023-05-20: 150 mg via INTRAVENOUS
  Administered 2023-05-20: 20 mg via INTRAVENOUS

## 2023-05-20 MED ORDER — LIDOCAINE HCL (CARDIAC) PF 100 MG/5ML IV SOSY
PREFILLED_SYRINGE | INTRAVENOUS | Status: DC | PRN
  Administered 2023-05-20: 50 mg via INTRAVENOUS

## 2023-05-20 MED ORDER — ONDANSETRON HCL 4 MG/2ML IJ SOLN
INTRAMUSCULAR | Status: DC | PRN
  Administered 2023-05-20: 4 mg via INTRAVENOUS

## 2023-05-20 MED ORDER — SUGAMMADEX SODIUM 200 MG/2ML IV SOLN
INTRAVENOUS | Status: DC | PRN
  Administered 2023-05-20: 140 mg via INTRAVENOUS

## 2023-05-20 MED ORDER — KETOROLAC TROMETHAMINE 15 MG/ML IJ SOLN
15.0000 mg | Freq: Once | INTRAMUSCULAR | Status: AC
  Administered 2023-05-20: 15 mg via INTRAVENOUS

## 2023-05-20 MED ORDER — PROPOFOL 10 MG/ML IV BOLUS
INTRAVENOUS | Status: AC
  Filled 2023-05-20: qty 20

## 2023-05-20 MED ORDER — ROCURONIUM BROMIDE 10 MG/ML (PF) SYRINGE
PREFILLED_SYRINGE | INTRAVENOUS | Status: AC
  Filled 2023-05-20: qty 10

## 2023-05-20 MED ORDER — FENTANYL CITRATE (PF) 100 MCG/2ML IJ SOLN
INTRAMUSCULAR | Status: AC
  Filled 2023-05-20: qty 2

## 2023-05-20 MED ORDER — PHENYLEPHRINE HCL (PRESSORS) 10 MG/ML IV SOLN
INTRAVENOUS | Status: DC | PRN
  Administered 2023-05-20: 160 ug via INTRAVENOUS
  Administered 2023-05-20: 80 ug via INTRAVENOUS
  Administered 2023-05-20: 160 ug via INTRAVENOUS
  Administered 2023-05-20: 80 ug via INTRAVENOUS
  Administered 2023-05-20: 160 ug via INTRAVENOUS
  Administered 2023-05-20: 80 ug via INTRAVENOUS

## 2023-05-20 MED ORDER — METOCLOPRAMIDE HCL 5 MG/ML IJ SOLN: 5.0000 mg | Freq: Three times a day (TID) | INTRAMUSCULAR | Status: DC | PRN

## 2023-05-20 MED ORDER — BUPIVACAINE HCL (PF) 0.5 % IJ SOLN
INTRAMUSCULAR | Status: DC | PRN
Start: 2023-05-20 — End: 2023-05-20
  Administered 2023-05-20: 10 mL via PERINEURAL

## 2023-05-20 MED ORDER — BUPIVACAINE LIPOSOME 1.3 % IJ SUSP
INTRAMUSCULAR | Status: DC | PRN
Start: 2023-05-20 — End: 2023-05-20
  Administered 2023-05-20: 20 mL via PERINEURAL

## 2023-05-20 MED ORDER — DEXAMETHASONE SODIUM PHOSPHATE 10 MG/ML IJ SOLN
INTRAMUSCULAR | Status: DC | PRN
  Administered 2023-05-20: 10 mg via INTRAVENOUS

## 2023-05-20 MED ORDER — BUPIVACAINE HCL (PF) 0.5 % IJ SOLN
INTRAMUSCULAR | Status: AC
  Filled 2023-05-20: qty 30

## 2023-05-20 MED ORDER — BUPIVACAINE-EPINEPHRINE (PF) 0.5% -1:200000 IJ SOLN
INTRAMUSCULAR | Status: DC | PRN
  Administered 2023-05-20: 30 mL

## 2023-05-20 MED ORDER — SODIUM CHLORIDE 0.9 % IV SOLN: INTRAVENOUS | Status: DC

## 2023-05-20 MED ORDER — CEFAZOLIN SODIUM-DEXTROSE 2-4 GM/100ML-% IV SOLN
2.0000 g | Freq: Four times a day (QID) | INTRAVENOUS | Status: DC
  Administered 2023-05-20: 2 g via INTRAVENOUS

## 2023-05-20 MED ORDER — EPHEDRINE SULFATE (PRESSORS) 50 MG/ML IJ SOLN
INTRAMUSCULAR | Status: DC | PRN
  Administered 2023-05-20: 10 mg via INTRAVENOUS
  Administered 2023-05-20: 5 mg via INTRAVENOUS

## 2023-05-20 MED ORDER — TRANEXAMIC ACID-NACL 1000-0.7 MG/100ML-% IV SOLN
INTRAVENOUS | Status: AC
  Filled 2023-05-20: qty 100

## 2023-05-20 SURGICAL SUPPLY — 75 items
APL PRP STRL LF DISP 70% ISPRP (MISCELLANEOUS) ×1
BASEPLATE AUG FULL 24 20D (Plate) IMPLANT
BIT DRILL FLUTED 3.0 STRL (BIT) IMPLANT
BLADE SAW SAG 25X90X1.19 (BLADE) ×1 IMPLANT
BSPLAT GLND 20D OBLQ 24 FULL (Plate) ×1 IMPLANT
CALIBRATOR GLENOID VIP 5-D (SYSTAGENIX WOUND MANAGEMENT) IMPLANT
CHLORAPREP W/TINT 26 (MISCELLANEOUS) ×1 IMPLANT
COOLER POLAR GLACIER W/PUMP (MISCELLANEOUS) ×1 IMPLANT
CUP SUT UNIV REVERS 36 NEUTRAL (Cup) IMPLANT
DRAPE INCISE IOBAN 66X45 STRL (DRAPES) ×1 IMPLANT
DRAPE SHEET LG 3/4 BI-LAMINATE (DRAPES) ×1 IMPLANT
DRAPE TABLE BACK 80X90 (DRAPES) ×1 IMPLANT
DRSG OPSITE POSTOP 4X6 (GAUZE/BANDAGES/DRESSINGS) IMPLANT
DRSG OPSITE POSTOP 4X8 (GAUZE/BANDAGES/DRESSINGS) ×1 IMPLANT
ELECT BLADE 6.5 EXT (BLADE) IMPLANT
ELECT CAUTERY BLADE 6.4 (BLADE) ×1 IMPLANT
ELECT REM PT RETURN 9FT ADLT (ELECTROSURGICAL) ×1
ELECTRODE REM PT RTRN 9FT ADLT (ELECTROSURGICAL) ×1 IMPLANT
GAUZE XEROFORM 1X8 LF (GAUZE/BANDAGES/DRESSINGS) ×1 IMPLANT
GLENOSPHERE 36 +4 LAT/24 (Joint) IMPLANT
GLOVE BIO SURGEON STRL SZ 6.5 (GLOVE) IMPLANT
GLOVE BIO SURGEON STRL SZ7.5 (GLOVE) ×4 IMPLANT
GLOVE BIO SURGEON STRL SZ8 (GLOVE) ×4 IMPLANT
GLOVE BIOGEL PI IND STRL 6.5 (GLOVE) IMPLANT
GLOVE BIOGEL PI IND STRL 8 (GLOVE) ×2 IMPLANT
GLOVE INDICATOR 8.0 STRL GRN (GLOVE) ×1 IMPLANT
GLOVE SURG SYN 8.0 (GLOVE) ×1 IMPLANT
GLOVE SURG SYN 8.0 PF PI (GLOVE) IMPLANT
GOWN STRL REUS W/ TWL LRG LVL3 (GOWN DISPOSABLE) ×1 IMPLANT
GOWN STRL REUS W/ TWL XL LVL3 (GOWN DISPOSABLE) ×1 IMPLANT
GOWN STRL REUS W/TWL LRG LVL3 (GOWN DISPOSABLE) ×1
GOWN STRL REUS W/TWL XL LVL3 (GOWN DISPOSABLE) ×1
HANDLE YANKAUER SUCT OPEN TIP (MISCELLANEOUS) ×1 IMPLANT
HOOD PEEL AWAY T7 (MISCELLANEOUS) ×3 IMPLANT
INSERT HUMERAL UNI REVERS 36 6 (Insert) IMPLANT
IV NS IRRIG 3000ML ARTHROMATIC (IV SOLUTION) ×1 IMPLANT
KIT STABILIZATION SHOULDER (MISCELLANEOUS) ×1 IMPLANT
KIT TURNOVER KIT A (KITS) ×1 IMPLANT
MANIFOLD NEPTUNE II (INSTRUMENTS) ×1 IMPLANT
MASK FACE SPIDER DISP (MASK) ×1 IMPLANT
MAT ABSORB FLUID 56X50 GRAY (MISCELLANEOUS) ×1 IMPLANT
NDL MAYO CATGUT SZ1 (NEEDLE) IMPLANT
NDL SAFETY ECLIP 18X1.5 (MISCELLANEOUS) ×1 IMPLANT
NDL SPNL 20GX3.5 QUINCKE YW (NEEDLE) ×1 IMPLANT
NEEDLE MAYO CATGUT SZ1 (NEEDLE) IMPLANT
NEEDLE SPNL 20GX3.5 QUINCKE YW (NEEDLE) ×1 IMPLANT
NS IRRIG 500ML POUR BTL (IV SOLUTION) ×1 IMPLANT
PACK ARTHROSCOPY SHOULDER (MISCELLANEOUS) ×1 IMPLANT
PAD ARMBOARD 7.5X6 YLW CONV (MISCELLANEOUS) ×1 IMPLANT
PAD WRAPON POLAR SHDR UNIV (MISCELLANEOUS) ×1 IMPLANT
PIN NITINOL TARGETER 2.8 (PIN) IMPLANT
POST MODULAR MGS BASEPLATE 25 (Post) IMPLANT
PULSAVAC PLUS IRRIG FAN TIP (DISPOSABLE) ×1
REAMER GLENOID UNIV SM DISP (ORTHOPEDIC DISPOSABLE SUPPLIES) IMPLANT
SCREW PERI LOCK 5.5X16 (Screw) IMPLANT
SCREW PERI LOCK 5.5X32 (Screw) IMPLANT
SCREW PERIPHERAL 5.5X20 LOCK (Screw) IMPLANT
SCREW PERIPHERAL NL 4.5X28 (Screw) IMPLANT
SLING ULTRA II M (MISCELLANEOUS) IMPLANT
SPONGE T-LAP 18X18 ~~LOC~~+RFID (SPONGE) ×2 IMPLANT
STAPLER SKIN PROX 35W (STAPLE) ×1 IMPLANT
STEM HUMERAL UNI REVERSE SZ10 (Stem) IMPLANT
SUT ETHIBOND 0 MO6 C/R (SUTURE) ×1 IMPLANT
SUT FIBERWIRE #2 38 BLUE 1/2 (SUTURE) ×4
SUT VIC AB 0 CT1 36 (SUTURE) ×1 IMPLANT
SUT VIC AB 2-0 CT1 27 (SUTURE) ×2
SUT VIC AB 2-0 CT1 TAPERPNT 27 (SUTURE) ×2 IMPLANT
SUTURE FIBERWR #2 38 BLUE 1/2 (SUTURE) ×4 IMPLANT
SYR 10ML LL (SYRINGE) ×1 IMPLANT
SYR 30ML LL (SYRINGE) ×1 IMPLANT
SYR TOOMEY 50ML (SYRINGE) ×1 IMPLANT
TIP FAN IRRIG PULSAVAC PLUS (DISPOSABLE) ×1 IMPLANT
TRAP FLUID SMOKE EVACUATOR (MISCELLANEOUS) ×1 IMPLANT
WATER STERILE IRR 500ML POUR (IV SOLUTION) ×1 IMPLANT
WRAPON POLAR PAD SHDR UNIV (MISCELLANEOUS) ×1

## 2023-05-20 NOTE — Transfer of Care (Signed)
Immediate Anesthesia Transfer of Care Note  Patient: Dennis Fuller  Procedure(s) Performed: REVERSE SHOULDER ARTHROPLASTY WITH BICEPS TENODESIS. (Right: Shoulder)  Patient Location: PACU  Anesthesia Type:GA combined with regional for post-op pain  Level of Consciousness: drowsy  Airway & Oxygen Therapy: Patient Spontanous Breathing and Patient connected to face mask oxygen  Post-op Assessment: Report given to RN and Post -op Vital signs reviewed and stable  Post vital signs: Reviewed  Last Vitals:  Vitals Value Taken Time  BP 124/68   Temp    Pulse 81 05/20/23 1314  Resp 15 05/20/23 1314  SpO2 99 % 05/20/23 1314  Vitals shown include unfiled device data.  Last Pain:  Vitals:   05/20/23 0902  TempSrc: Temporal  PainSc: 0-No pain         Complications: No notable events documented.

## 2023-05-20 NOTE — Op Note (Signed)
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  Post-Op Diagnosis:   Same  Procedure:   Reverse right total shoulder arthroplasty with biceps tenodesis.  Surgeon:   Maryagnes Amos, MD  Assistant:   Horris Latino, PA-C; Jacqulyn Liner, PA-S  Anesthesia:   General endotracheal with an interscalene block using Exparel placed preoperatively by the anesthesiologist.  Findings:   As above.  Complications:   None  EBL:   100 cc  Fluids:   1000 cc crystalloid  UOP:   None  TT:   None  Drains:   None  Closure:   Staples  Implants:   All press-fit Arthrex system with a #10 Univers Revers humeral stem, a 36 mm SutureCup with a +6 millimeter constrained insert, and a 24 mm 20 degree augmented base plate with a 36 mm +4 mm lateralized glenosphere.  Brief Clinical Note:   The patient is a 77 year old male with a long history of progressively worsening right shoulder pain, weakness, and stiffness. His symptoms have progressed despite medications, activity modification, etc. His history and examination consistent with advanced degenerative joint disease confirmed by plain radiographs. Preoperative CT scanning confirmed the presence of significant rotator cuff tendinopathy without a clear rotator cuff tear. The patient presents at this time for a reverse right total shoulder arthroplasty with biceps tenodesis.  Procedure:   The patient underwent placement of an interscalene block using Exparel by the anesthesiologist in the preoperative holding area before being brought into the operating room and lain in the supine position. The patient then underwent general endotracheal intubation and anesthesia before the patient was repositioned in the beach chair position using the beach chair positioner. The right shoulder and upper extremity were prepped with ChloraPrep solution  before being draped sterilely. Preoperative antibiotics were administered. A timeout was performed to verify the appropriate surgical site.    A standard anterior approach to the shoulder was made through an approximately 4-5 inch incision. The incision was carried down through the subcutaneous tissues to expose the deltopectoral fascia. The interval between the deltoid and pectoralis muscles was identified and this plane developed, retracting the cephalic vein laterally with the deltoid muscle. The conjoined tendon was identified. Its lateral margin was dissected and the Kolbel self-retraining retractor inserted. The "three sisters" were identified and cauterized. Bursal tissues were removed to improve visualization.   The biceps tendon was identified near the inferior aspect of the bicipital groove. A soft tissue tenodesis was performed by attaching the biceps tendon to the adjacent pectoralis major tendon using two #0 Ethibond interrupted sutures. The biceps tendon was then transected just proximal to the tenodesis site. The subscapularis tendon was released from its attachment to the lesser tuberosity 1 cm proximal to its insertion and several tagging sutures placed. The inferior capsule was released with care after identifying and protecting the axillary nerve. The proximal humeral cut was made at approximately 30 of retroversion using the extra-medullary guide.   Attention was redirected to the glenoid. Utilizing the customized guide made from the preoperative CT scan to optimize position of the guidewire, the guidewire was drilled into the glenoid neck. After verifying its position, it was overreamed with first the central boss reamer and then the 20 degree augmented baseplate reamer to create a flat surface. The central 10 x 30 mm coring reamer was then used to complete the glenoid preparation. The permanent  mini-baseplate construct comprising the 20 degree augmented baseplate with the 30 mm post was  impacted into place with care taken to maintain the appropriate orientation to optimize positioning of the augment. The baseplate was then further secured using four peripheral screws. One non-locking screw was placed inferiorly before three locking screws were placed superiorly, anteriorly, and posteriorly. The periphery was assessed using the peripheral reamer to be sure that the glenosphere would sit properly. Areas of osteophytic ridging were removed with a rongeur superiorly and anteriorly to enable proper positioning of the glenosphere. The permanent 36 mm +4 mm lateralized glenosphere was then impacted into place and its Morse taper locking mechanism verified using manual distraction. Finally, the glenosphere locking screw was inserted and tightened securely.  Attention was directed to the humeral side. The humeral canal was reamed with first the 5 mm reamer, then progressing through the reamers up to the 8 mm. The canal was broached beginning with a #5 broach and progressing to a #10 broach. This was left in place and the metaphyseal inset reamer was used to create the metaphyseal socket. A trial reduction performed using the 36 mm trial humeral platform with first the +3 mm insert and then the 6 mm constrained insert. With the 6 mm constrained insert, the arm demonstrated excellent range of motion as the hand could be brought across the chest to the opposite shoulder and brought to the top of the patient's head and to the patient's ear. The shoulder appeared stable throughout this range of motion. The joint was dislocated and the trial components removed. The permanent #10 Univers Revers stem with the permanent 36 mm suture cup humeral platform was put together on the back table and then impacted into place with care taken to maintain the appropriate version. Repeat trialing was performed using both the +3 mm and +6 mm constrained inserts with the findings as described above. The +6 mm constrained insert  was snapped into place. The shoulder was relocated using two finger pressure and again placed through a range of motion with the findings as described above.  The wound was copiously irrigated with sterile saline solution using the jet lavage system before a total of 30 cc of 0.5% Sensorcaine with epinephrine was injected into the pericapsular and peri-incisional tissues to help with postoperative analgesia. The subscapularis tendon was reapproximated using #2 FiberWire interrupted sutures. The deltopectoral interval was closed using #0 Vicryl interrupted sutures before the subcutaneous tissues were closed using 2-0 Vicryl interrupted sutures. The skin was closed using staples. A sterile occlusive dressing was applied to the wound before the arm was placed into a shoulder immobilizer with an abduction pillow. A Polar Care system also was applied to the shoulder. The patient was then transferred back to a hospital bed before being awakened, extubated, and returned to the recovery room in satisfactory condition after tolerating the procedure well.

## 2023-05-20 NOTE — Evaluation (Signed)
Occupational Therapy Evaluation Patient Details Name: Dennis Fuller MRN: [id number] DOB: 07/03/1946 Today's Date: 05/20/2023   History of Present Illness Dennis Fuller is a 77 y.o. s/p right reverse total shoulder arthroplasty with bicep tenodesis on 05/20/2023.   Clinical Impression   Dennis Fuller was seen for OT evaluation this date. Prior to hospital admission, pt was IND. Pt lives with spouse. Pt currently requires MAX A don sling/polar care/shirt in sitting. SBA for funcitonal mobility and to navigate 4 steps. Pt instructed in polar care mgt, sling/immobilizer mgt, ROM exercises for RUE (with instructions for no shoulder exercises until full sensation has returned), RUE precautions, adaptive strategies for bathing/dressing/, and positioning considerations for sleep. Handout provided. All education complete, will sign off. Upon hospital discharge, recommend no OT follow up.    If plan is discharge home, recommend the following: A lot of help with bathing/dressing/bathroom    Functional Status Assessment  Patient has had a recent decline in their functional status and demonstrates the ability to make significant improvements in function in a reasonable and predictable amount of time.  Equipment Recommendations  None recommended by OT    Recommendations for Other Services       Precautions / Restrictions Precautions Precautions: Shoulder Shoulder Interventions: Shoulder sling/immobilizer;Shoulder abduction pillow;Off for dressing/bathing/exercises Precaution Booklet Issued: Yes (comment) Restrictions Weight Bearing Restrictions: Yes RUE Weight Bearing: Non weight bearing      Mobility Bed Mobility               General bed mobility comments: not tested    Transfers Overall transfer level: Needs assistance Equipment used: None Transfers: Sit to/from Stand Sit to Stand: Supervision                  Balance Overall balance assessment: No apparent balance deficits (not  formally assessed)                                         ADL either performed or assessed with clinical judgement   ADL Overall ADL's : Needs assistance/impaired                                       General ADL Comments: MAX A don sling/polar care/shirt in sitting. SBA for funcitonal mobility and to navigate 4 steps      Pertinent Vitals/Pain Pain Assessment Pain Assessment: No/denies pain     Extremity/Trunk Assessment Upper Extremity Assessment Upper Extremity Assessment: Right hand dominant   Lower Extremity Assessment Lower Extremity Assessment: Overall WFL for tasks assessed       Communication Communication Communication: No apparent difficulties   Cognition Arousal: Alert Behavior During Therapy: WFL for tasks assessed/performed Overall Cognitive Status: Within Functional Limits for tasks assessed                                             OT Problem List: Decreased activity tolerance;Decreased range of motion         OT Goals(Current goals can be found in the care plan section) Acute Rehab OT Goals Patient Stated Goal: go home OT Goal Formulation: With patient/family Time For Goal Achievement: 05/20/23 Potential to Achieve Goals: Good   AM-PAC OT "  6 Clicks" Daily Activity     Outcome Measure Help from another person eating meals?: None Help from another person taking care of personal grooming?: None Help from another person toileting, which includes using toliet, bedpan, or urinal?: A Little Help from another person bathing (including washing, rinsing, drying)?: A Little Help from another person to put on and taking off regular upper body clothing?: A Lot Help from another person to put on and taking off regular lower body clothing?: A Little 6 Click Score: 19   End of Session Nurse Communication: Mobility status  Activity Tolerance: Patient tolerated treatment well Patient left: in chair;with  call bell/phone within reach;with family/visitor present  OT Visit Diagnosis: Unsteadiness on feet (R26.81)                Time: 9629-5284 OT Time Calculation (min): 56 min Charges:  OT General Charges $OT Visit: 1 Visit OT Evaluation $OT Eval Low Complexity: 1 Low OT Treatments $Self Care/Home Management : 38-52 mins   Kathie Dike, M.S. OTR/L  05/20/23, 4:30 PM  ascom [phone]

## 2023-05-20 NOTE — Anesthesia Procedure Notes (Signed)
Anesthesia Regional Block: Interscalene brachial plexus block   Pre-Anesthetic Checklist: , timeout performed,  Correct Patient, Correct Site, Correct Laterality,  Correct Procedure, Correct Position, site marked,  Risks and benefits discussed,  Surgical consent,  Pre-op evaluation,  At surgeon's request and post-op pain management  Laterality: Upper and Right  Prep: chloraprep       Needles:  Injection technique: Single-shot  Needle Type: Echogenic Stimulator Needle     Needle Length: 10cm  Needle Gauge: 20     Additional Needles:   Procedures:,,,, ultrasound used (permanent image in chart),,    Narrative:  End time: 05/20/2023 10:00 AM Injection made incrementally with aspirations every 5 mL.  Performed by: Personally  Anesthesiologist: Yevette Edwards, MD  Additional Notes: Pt. Identified and accepting of procedure after risks and benefits fully reviewed and questions answered. Time out performed and laterality confirmed prior to procedure.  ISNB  performed without difficulty and well tolerated.  Neg IV and SATD.  No pain on injection of Local anesthetic and VSST.

## 2023-05-20 NOTE — Anesthesia Preprocedure Evaluation (Signed)
Anesthesia Evaluation  Patient identified by MRN, date of birth, ID band Patient awake    Reviewed: Allergy & Precautions, H&P , NPO status , Patient's Chart, lab work & pertinent test results, reviewed documented beta blocker date and time   Airway Mallampati: II  TM Distance: >3 FB Neck ROM: full    Dental  (+) Teeth Intact   Pulmonary neg pulmonary ROS, COPD,  COPD inhaler, Current Smoker   Pulmonary exam normal        Cardiovascular Exercise Tolerance: Good hypertension, On Medications negative cardio ROS Normal cardiovascular exam Rhythm:regular Rate:Normal     Neuro/Psych negative neurological ROS  negative psych ROS   GI/Hepatic negative GI ROS, Neg liver ROS,,,  Endo/Other  negative endocrine ROS    Renal/GU negative Renal ROS  negative genitourinary   Musculoskeletal   Abdominal   Peds  Hematology negative hematology ROS (+)   Anesthesia Other Findings Past Medical History: No date: COPD (chronic obstructive pulmonary disease) (HCC) No date: Hypertension No past surgical history on file.   Reproductive/Obstetrics negative OB ROS                             Anesthesia Physical Anesthesia Plan  ASA: 3  Anesthesia Plan: General ETT   Post-op Pain Management: Regional block*   Induction:   PONV Risk Score and Plan: 2  Airway Management Planned:   Additional Equipment:   Intra-op Plan:   Post-operative Plan:   Informed Consent: I have reviewed the patients History and Physical, chart, labs and discussed the procedure including the risks, benefits and alternatives for the proposed anesthesia with the patient or authorized representative who has indicated his/her understanding and acceptance.     Dental Advisory Given  Plan Discussed with: CRNA  Anesthesia Plan Comments:        Anesthesia Quick Evaluation

## 2023-05-20 NOTE — Discharge Instructions (Addendum)
Orthopedic discharge instructions: May shower with intact OpSite dressing once nerve block has worn off (around Monday morning).  Apply ice frequently to shoulder or use Polar Care device. Take ibuprofen 600-800 mg TID with meals for 3-5 days, then as necessary. May supplement with ES Tylenol for pain medication if necessary. Keep shoulder immobilizer on at all times except may remove for bathing purposes. Follow-up in 10-14 days or as scheduled.  AMBULATORY SURGERY  DISCHARGE INSTRUCTIONS   The drugs that you were given will stay in your system until tomorrow so for the next 24 hours you should not:  Drive an automobile Make any legal decisions Drink any alcoholic beverage   You may resume regular meals tomorrow.  Today it is better to start with liquids and gradually work up to solid foods.  You may eat anything you prefer, but it is better to start with liquids, then soup and crackers, and gradually work up to solid foods.   Please notify your doctor immediately if you have any unusual bleeding, trouble breathing, redness and pain at the surgery site, drainage, fever, or pain not relieved by medication.    Additional Instructions: PLEASE LEAVE EXPAREL (TEAL) ARMBAND ON FOR 4 DAYS    Please contact your physician with any problems or Same Day Surgery at [phone], Monday through Friday 6 am to 4 pm, or [place] at [facility] number at [phone].  POLAR CARE INFORMATION  MassAdvertisement.it  How to use [employer] Polar Care [facility] Therapy System?  YouTube   ShippingScam.co.uk  OPERATING INSTRUCTIONS  Start the product With dry hands, connect the transformer to the electrical connection located on the top of the cooler. Next, plug the transformer into an appropriate electrical outlet. The unit will automatically start running at this point.  To stop the pump, disconnect electrical power.  Unplug to stop the product when not in use.  Unplugging the Polar Care unit turns it off. Always unplug immediately after use. Never leave it plugged in while unattended. Remove pad.    FIRST ADD WATER TO FILL LINE, THEN ICE---Replace ice when existing ice is almost melted  1 Discuss Treatment with your Licensed Health Care Practitioner and Use Only as Prescribed 2 Apply Insulation Barrier & Cold Therapy Pad 3 Check for Moisture 4 Inspect Skin Regularly  Tips and Trouble Shooting Usage Tips 1. Use cubed or chunked ice for optimal performance. 2. It is recommended to drain the Pad between uses. To drain the pad, hold the Pad upright with the hose pointed toward the ground. Depress the black plunger and allow water to drain out. 3. You may disconnect the Pad from the unit without removing the pad from the affected area by depressing the silver tabs on the hose coupling and gently pulling the hoses apart. The Pad and unit will seal itself and will not leak. Note: Some dripping during release is normal. 4. DO NOT RUN PUMP WITHOUT WATER! The pump in this unit is designed to run with water. Running the unit without water will cause permanent damage to the pump. 5. Unplug unit before removing lid.  TROUBLESHOOTING GUIDE Pump not running, Water not flowing to the pad, Pad is not getting cold 1. Make sure the transformer is plugged into the wall outlet. 2. Confirm that the ice and water are filled to the indicated levels. 3. Make sure there are no kinks in the pad. 4. Gently pull on the blue tube to make sure the tube/pad junction is straight. 5. Remove  the pad from the treatment site and ll it while the pad is lying at; then reapply. 6. Confirm that the pad couplings are securely attached to the unit. Listen for the double clicks (Figure 1) to confirm the pad couplings are securely attached.  Leaks    Note: Some condensation on the lines, controller, and pads is unavoidable, especially in warmer climates. 1. If using a B[employer] Polar Care Cold  Therapy unit with a detachable Cold Therapy Pad, and a leak exists (other than condensation on the lines) disconnect the pad couplings. Make sure the silver tabs on the couplings are depressed before reconnecting the pad to the pump hose; then confirm both sides of the coupling are properly clicked in. 2. If the coupling continues to leak or a leak is detected in the pad itself, stop using it and call [employer] Customer Care at [phone].  Cleaning After use, empty and dry the unit with a soft cloth. Warm water and mild detergent may be used occasionally to clean the pump and tubes.  WARNING: The Polar Care Cube can be cold enough to cause serious injury, including full skin necrosis. Follow these Operating Instructions, and carefully read the Product Insert (see pouch on side of unit) and the Cold Therapy Pad Fitting Instructions (provided with each Cold Therapy Pad) prior to use.   SHOULDER SLING IMMOBILIZER   VIDEO Slingshot 2 Shoulder Brace Application - YouTube ---https://www.porter.info/  INSTRUCTIONS While supporting the injured arm, slide the forearm into the sling. Wrap the adjustable shoulder strap around the neck and shoulders and attach the strap end to the sling using  the "alligator strap tab."  Adjust the shoulder strap to the required length. Position the shoulder pad behind the neck. To secure the shoulder pad location (optional), pull the shoulder strap away from the shoulder pad, unfold the hook material on the top of the pad, then press the shoulder strap back onto the hook material to secure the pad in place. Attach the closure strap across the open top of the sling. Position the strap so that it holds the arm securely in the sling. Next, attach the thumb strap to the open end of the sling between the thumb and fingers. After sling has been fit, it may be easily removed and reapplied using the quick release buckle on shoulder strap. If a neutral pillow or  15 abduction pillow is included, place the pillow at the waistline. Attach the sling to the pillow, lining up hook material on the pillow with the loop on sling. Adjust the waist strap to fit.  If waist strap is too long, cut it to fit. Use the small piece of double sided hook material (located on top of the pillow) to secure the strap end. Place the double sided hook material on the inside of the cut strap end and secure it to the waist strap.     If no pillow is included, attach the waist strap to the sling and adjust to fit.    Washing Instructions: Straps and sling must be removed and cleaned regularly depending on your activity level and perspiration. Hand wash straps and sling in cold water with mild detergent, rinse, air dry        Interscalene Nerve Block with Exparel   For your surgery you have received an Interscalene Nerve Block with Exparel. Nerve Blocks affect many types of nerves, including nerves that control movement, pain and normal sensation.  You may experience feelings such as numbness, tingling,  heaviness, weakness or the inability to move your arm or the feeling or sensation that your arm has "fallen asleep". A nerve block with Exparel can last up to 5 days.  Usually the weakness wears off first.  The tingling and heaviness usually wear off next.  Finally you may start to notice pain.  Keep in mind that this may occur in any order.  Once a nerve block starts to wear off it is usually completely gone within 60 minutes. ISNB may cause mild shortness of breath, a hoarse voice, blurry vision, unequal pupils, or drooping of the face on the same side as the nerve block.  These symptoms will usually resolve with the numbness.  Very rarely the procedure itself can cause mild seizures. If needed, your surgeon will give you a prescription for pain medication.  It will take about 60 minutes for the oral pain medication to become fully effective.  So, it is recommended that you start taking  this medication before the nerve block first begins to wear off, or when you first begin to feel discomfort. Take your pain medication only as prescribed.  Pain medication can cause sedation and decrease your breathing if you take more than you need for the level of pain that you have. Nausea is a common side effect of many pain medications.  You may want to eat something before taking your pain medicine to prevent nausea. After an Interscalene nerve block, you cannot feel pain, pressure or extremes in temperature in the effected arm.  Because your arm is numb it is at an increased risk for injury.  To decrease the possibility of injury, please practice the following:  While you are awake change the position of your arm frequently to prevent too much pressure on any one area for prolonged periods of time.  If you have a cast or tight dressing, check the color or your fingers every couple of hours.  Call your surgeon with the appearance of any discoloration (white or blue). If you are given a sling to wear before you go home, please wear it  at all times until the block has completely worn off.  Do not get up at night without your sling. Please contact [facility] Anesthesia or your surgeon if you do not begin to regain sensation after 7 days from the surgery.  Anesthesia may be contacted by calling the Same Day Surgery Department, Mon. through Fri., 6 am to 4 pm at [phone].   If you experience any other problems or concerns, please contact your surgeon's office. If you experience severe or prolonged shortness of breath go to the nearest emergency department.

## 2023-05-20 NOTE — Anesthesia Procedure Notes (Signed)
Procedure Name: Intubation Date/Time: 05/20/2023 10:52 AM  Performed by: Mathews Argyle, CRNAPre-anesthesia Checklist: Patient identified, Patient being monitored, Timeout performed, Emergency Drugs available and Suction available Patient Re-evaluated:Patient Re-evaluated prior to induction Oxygen Delivery Method: Circle system utilized Preoxygenation: Pre-oxygenation with 100% oxygen Induction Type: IV induction Ventilation: Mask ventilation without difficulty Laryngoscope Size: McGraph and 4 Grade View: Grade I Tube type: Oral Tube size: 7.5 mm Number of attempts: 1 Airway Equipment and Method: Stylet and Video-laryngoscopy Placement Confirmation: ETT inserted through vocal cords under direct vision, positive ETCO2 and breath sounds checked- equal and bilateral Secured at: 23 cm Tube secured with: Tape Dental Injury: Teeth and Oropharynx as per pre-operative assessment

## 2023-05-20 NOTE — H&P (Signed)
History of Present Illness: Dennis Fuller is a 77 y.o. who presents today for history and physical. He is to undergo a right reverse total shoulder arthroplasty with bicep tenodesis on 05/20/2023. Since his last visit here to clinic there have been no improvement in his condition. The patient expresses his desire to proceed with surgery.  Patient has been experiencing discomfort to the right shoulder for quite some time. He received a steroid injection by Dr. Landry Mellow in November, 2023 which she states provided moderate relief of his symptoms. However, this injection has worn off, prompting him to make this appointment. On today's visit, he rates his pain at 2/10. He has been taking Tylenol and Motrin as necessary with temporary partial relief of his symptoms. His symptoms are worse with activities at or above shoulder level, as well as when he sleeps at night. He also recalls experiencing several episodes of more severe pain lasting several hours which has prompted him to make this appointment and make him reconsider surgical intervention. He denies any specific reinjury to the shoulder, and denies any numbness or paresthesias down his arm to his hand.   Patient was then seen by Dr. Joice Lofts for which a CT of the shoulder was ordered. 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  Past Medical History: Hyperlipidemia  Hypertension   Past Surgical History: COLONOSCOPY   Past Family History History reviewed. No pertinent family history.  Medications: aspirin 81 MG chewable tablet Take 81 mg by mouth once daily  atorvastatin (LIPITOR) 20 MG tablet Take 20 mg by mouth once daily  cholecalciferol (VITAMIN D3) 1000 unit tablet Take 1 tablet by mouth once daily  folic acid (FOLVITE) 1 MG tablet Take 1 mg by mouth once daily  ibuprofen (MOTRIN) 200 MG tablet Take 200 mg by mouth every 6 (six) hours as needed for Pain  lisinopriL (ZESTRIL) 40  MG tablet Take 40 mg by mouth once daily  multivitamin tablet Take 1 tablet by mouth once daily  NIFEdipine (ADALAT CC) 60 MG ER tablet Take 60 mg by mouth once daily TAKE ONE TABLET BY MOUTH EVERY DAY FOR BLOOD PRESSURE. TAKE ON AN EMPTY STOMACH. SWALLOW WHOLE, DO NOT CHEW OR CRUSH TABLETS.  sennosides-docusate (SENOKOT-S) 8.6-50 mg tablet Take 1 tablet by mouth once daily  tiotropium bromide (SPIRIVA RESPIMAT) 2.5 mcg/actuation inhalation spray Inhale 5 mcg into the lungs once daily   Allergies: Hydrochlorothiazide Other (See Comments)   Review of Systems A comprehensive 14 point ROS was performed, reviewed, and the pertinent orthopaedic findings are documented in the HPI.  Physical Exam: BP 130/60 (BP Location: Left upper arm, Patient Position: Sitting, BP Cuff Size: Adult)  Ht 177.8 cm (5\' 10" )  Wt 72.2 kg (159 lb 3.2 oz)  BMI 22.84 kg/m   General: Well-developed well-nourished male seen in no acute distress.   HEENT: Atraumatic,normocephalic. Pupils are equal and reactive to light. Oropharynx is clear with moist mucosa  Lungs: Clear to auscultation bilaterally   Cardiovascular: Regular rate and rhythm. Normal S1, S2. Strong 3/5 systolic murmur, no appreciable gallops or rubs. Peripheral pulses are palpable.  Abdomen: Soft, non-tender, nondistended. Bowel sounds present  Right shoulder exam: SKIN: Normal SWELLING: None WARMTH: None LYMPH NODES: No adenopathy palpable CREPITUS: Mild crepitance of the glenohumeral joint TENDERNESS: Minimal tenderness along lateral acromion ROM (active):  Forward flexion: 100 degrees Abduction: 90 degrees Internal rotation: Right PSIS ROM (passive):  Forward flexion: 120 degrees Abduction: 110 degrees ER/IR  at 90 abd: 70 degrees/50 degrees  He experiences mild-moderate pain and crepitance at the extremes of all motions.  STRENGTH: Forward flexion: 4+/5 Abduction: 4+/5 External rotation: 4-4+/5 Internal rotation: 4+/5 Pain with  RC testing: No  STABILITY: Normal  SPECIAL TESTS: Juanetta Gosling' test: Mildly positive Speed's test: Negative Capsulitis - pain w/ passive ER: No Crossed arm test: Negative Crank: Not evaluated Anterior apprehension: Negative Posterior apprehension: Not evaluated  Neurological: The patient is alert and oriented Sensation to light touch appears to be intact and within normal limits Gross motor strength appeared to be equal to 5/5  Vascular : Peripheral pulses felt to be palpable. Capillary refill appears to be intact and within normal limits  X-rays: X-rays taken in [place] [facility] showed severe glenohumeral joint degenerative changes with osteophyte formation and subchondral sclerosis been noted. Also noted was subchondral cystic changes. There is mild degenerative changes to the [facility] joint. No acute bony abnormalities noted.  CT OF THE UPPER RIGHT EXTREMITY WITHOUT CONTRAST: 1. Stable CT of the right shoulder with severe glenohumeral degenerative changes as described. Persistent moderate-sized joint effusion. 2. No acute osseous findings. 3. No gross rotator cuff impingement or focal muscular atrophy identified by CT. 4. Aortic Atherosclerosis (ICD10-I70.0).  Impression: 1. Degenerative versus right shoulder.  Plan:     H&P reviewed and patient re-examined. No changes.

## 2023-05-21 ENCOUNTER — Encounter: Payer: Self-pay | Admitting: Surgery

## 2023-05-24 LAB — SURGICAL PATHOLOGY

## 2023-06-01 NOTE — Anesthesia Postprocedure Evaluation (Signed)
Anesthesia Post Note  Patient: Dennis Fuller  Procedure(s) Performed: REVERSE SHOULDER ARTHROPLASTY WITH BICEPS TENODESIS. (Right: Shoulder)  Patient location during evaluation: PACU Anesthesia Type: General Level of consciousness: awake and alert Pain management: pain level controlled Vital Signs Assessment: post-procedure vital signs reviewed and stable Respiratory status: spontaneous breathing, nonlabored ventilation, respiratory function stable and patient connected to nasal cannula oxygen Cardiovascular status: blood pressure returned to baseline and stable Postop Assessment: no apparent nausea or vomiting Anesthetic complications: no   No notable events documented.   Last Vitals:  Vitals:   05/20/23 1427 05/20/23 1631  BP: 114/65 129/61  Pulse: 88 98  Resp: 16 16  Temp: (!) 36.1 C (!) 36.3 C  SpO2: 92% 94%    Last Pain:  Vitals:   05/20/23 1631  TempSrc: Temporal  PainSc: 0-No pain                 Yevette Edwards

## 2024-05-16 ENCOUNTER — Other Ambulatory Visit (INDEPENDENT_AMBULATORY_CARE_PROVIDER_SITE_OTHER): Payer: Self-pay | Admitting: Vascular Surgery

## 2024-05-16 DIAGNOSIS — I739 Peripheral vascular disease, unspecified: Secondary | ICD-10-CM

## 2024-05-19 ENCOUNTER — Encounter (INDEPENDENT_AMBULATORY_CARE_PROVIDER_SITE_OTHER): Payer: Self-pay | Admitting: Vascular Surgery

## 2024-05-19 ENCOUNTER — Ambulatory Visit (INDEPENDENT_AMBULATORY_CARE_PROVIDER_SITE_OTHER): Admitting: Vascular Surgery

## 2024-05-19 ENCOUNTER — Other Ambulatory Visit (INDEPENDENT_AMBULATORY_CARE_PROVIDER_SITE_OTHER)

## 2024-05-19 ENCOUNTER — Telehealth (INDEPENDENT_AMBULATORY_CARE_PROVIDER_SITE_OTHER): Payer: Self-pay

## 2024-05-19 VITALS — BP 128/74 | HR 84 | Ht 69.0 in | Wt 156.4 lb

## 2024-05-19 DIAGNOSIS — I70229 Atherosclerosis of native arteries of extremities with rest pain, unspecified extremity: Secondary | ICD-10-CM | POA: Insufficient documentation

## 2024-05-19 DIAGNOSIS — F17200 Nicotine dependence, unspecified, uncomplicated: Secondary | ICD-10-CM

## 2024-05-19 DIAGNOSIS — I739 Peripheral vascular disease, unspecified: Secondary | ICD-10-CM | POA: Diagnosis not present

## 2024-05-19 DIAGNOSIS — I10 Essential (primary) hypertension: Secondary | ICD-10-CM | POA: Diagnosis not present

## 2024-05-19 DIAGNOSIS — I70221 Atherosclerosis of native arteries of extremities with rest pain, right leg: Secondary | ICD-10-CM | POA: Diagnosis not present

## 2024-05-19 MED ORDER — CLOPIDOGREL BISULFATE 75 MG PO TABS: 75.0000 mg | ORAL_TABLET | Freq: Every day | ORAL | 6 refills | Status: AC

## 2024-05-19 MED ORDER — OXYCODONE-ACETAMINOPHEN 5-325 MG PO TABS
1.0000 | ORAL_TABLET | ORAL | 0 refills | Status: DC | PRN
Start: 2024-05-19 — End: 2024-05-26

## 2024-05-19 NOTE — Assessment & Plan Note (Signed)
 Noninvasive studies were performed today to evaluate the perfusion.  His right ABI 0.00 and his left ABI 0.69.  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   This is clearly a critical and limb threatening situation on the right leg.  His left leg has claudication but no acute limb threatening issues. I have discussed the grave nature of the situation.  I discussed that even with revascularization, limb loss is possible.  It is hard to say how symptomatic his left leg is due to the severe pain in his right leg now, but this may need to be addressed in the future.  The left leg is not acutely limb threatening. I have recommended he begin Plavix  in addition to his aspirin and Lipitor.  He can do this instead of Trental.  I will give him some narcotic pain medication for the next couple of days and we will get him on the angiogram schedule as soon as possible.  I have discussed the procedure in detail.  I discussed the risks and benefits of the procedure.  I have also discussed that there is a significant chance he will need to stay overnight after the procedure and also a significant chance that endovascular revascularization will not be an option due to the severity of the disease.  They voiced their understanding and are agreeable to proceed.

## 2024-05-19 NOTE — Patient Instructions (Signed)
 Poor Blood Flow (Peripheral Vascular Disease): What to Know  Peripheral vascular disease (PVD) is a disease of the blood vessels. It usually affects arteries, which are the blood vessels that carry blood from the heart to the rest of the body. PVD is also called peripheral artery disease (PAD) or poor blood flow. PVD can result in less blood flowing to the arms, legs, and organs such as the stomach or kidneys. It most often affects the lower legs and feet. Without treatment, PVD often gets worse. PVD can lead to acute limb ischemia. This happens when there's a sudden stop of blood flow to an arm or leg. This is a medical emergency. What are the causes? The most common cause of PVD is a buildup of a fatty substance called plaque inside the arteries. This decreases blood flow. Plaque can also break off and block blood flow in small arteries. Here are some other common causes of PVD: Blood clots inside the blood vessels. Injuries to blood vessels. Irritation and swelling of blood vessels. Sudden tightening, or spasms, of the blood vessel. What increases the risk? Using tobacco or nicotine products. A family history of PVD. Common medical conditions, including: High cholesterol. Diabetes. High blood pressure. Heart disease. Other conditions, such as: Buerger's disease. This is caused by swollen and irritated blood vessels in your hands and feet. Past problems with blocked blood vessels. Kidney disease. Not getting enough exercise. Being very overweight. What are the signs or symptoms? Symptoms depend on what body part isn't getting enough blood. They may include: Cramps in your butt, legs, and feet. Pain and weakness in your legs when you're active that goes away when you rest. Leg pain when at rest along with leg numbness, tingling, or weakness. Coldness in a leg or foot, especially when compared to the other leg or foot. Skin or hair changes on the arms or legs, such as hair loss or  shiny skin. Erectile dysfunction. This means not being able to get or keep an erection. Weak pulse or no pulse in the feet. People with PVD are more likely to get open wounds and sores on their toes, feet, or legs. The sores may take longer than normal to heal. How is this diagnosed? PVD is diagnosed based on your symptoms, a physical exam, and your medical history. You may also have tests to find the cause. These may include: Ankle-brachial index test.This test compares the blood pressure readings of the legs and arms. Doppler ultrasound. This takes pictures of blood flow through your blood vessels. Imaging tests that use dye to show blood flow. These are: CT angiogram. Magnetic resonance angiogram, or MRA. How is this treated? The cause of your PVD is treated first. Other conditions, like diabetes or high cholesterol, are also treated. Treatment may include: Quitting smoking or tobacco use. Exercise programs that your health care provider says are right for you. Taking medicines, such as: Blood thinners to prevent blood clots. Medicines to improve blood flow. Medicines to improve cholesterol levels. Procedures to: Open a blocked artery and restore blood flow. This is called angioplasty. Make a new path for blood to flow around a blocked artery. This is called peripheral bypass surgery. Remove an affected leg or arm. This may be needed in cases of acute limb ischemia when other treatments haven't helped. Follow these instructions at home: Medicines Take your medicines only as told. If you're taking blood thinners: Take your medicines as told. Take them at the same time each day. Talk with  your provider before taking any products you can buy at the store. Do not do things that could hurt or bruise you. Be careful to avoid falls. Wear an alert bracelet or carry a card that says you take blood thinners. Lifestyle  Do not smoke, vape, or use nicotine or tobacco. Get regular exercise.  Ask your provider about good activities for you. Eat a diet that's low in fat and cholesterol. Talk with your provider about staying at a healthy weight. If needed, ask about losing weight. Do not drink alcohol. General instructions Take good care of your feet. To do this: Wear shoes that fit well and feel good. Check your feet often for any cuts or sores. Get vaccines as recommended by your provider. Where to find more information American Heart Association: heart.org National Heart, Lung, and Blood Institute: [employer] Contact a health care provider if: You have cramps in your legs when you walk. You have leg pain when you're resting. Your leg or foot feels cold. Your skin changes color. You can't get or keep an erection. You have cuts or sores on your legs or feet that don't heal. Get help right away if: You have chest pain or trouble breathing. You have sudden changes in the color and feeling of your arms or legs, such as: Your arm or leg turns cold, numb, and blue. Your arm or leg becomes red, warm, swollen, painful, or numb. You have any signs of a stroke. "BE FAST" is an easy way to remember the main warning signs: B - Balance. Feeling dizzy, sudden trouble walking, or loss of balance. E - Eyes. Trouble seeing or a change in how you see. F - Face. Sudden weakness or feeling numb in the face. The face or eyelid may droop on one side. A - Arms. Weakness or loss of feeling in an arm. This happens fast and often only on one side. S - Speech. Sudden trouble speaking, slurred speech, or trouble understanding what people say. T - Time. Time to call 911. Write down what time symptoms started. Other signs of a stroke can be: A sudden, very bad headache with no known cause. Feeling like you may throw up. Throwing up. These symptoms may be an emergency. Call 911 right away. Do not wait to see if the symptoms will go away. Do not drive yourself to the hospital. This information  is not intended to replace advice given to you by your health care provider. Make sure you discuss any questions you have with your health care provider. Document Revised: 07/06/2023 Document Reviewed: 02/02/2023 Elsevier Patient Education  2024 [employer].

## 2024-05-19 NOTE — H&P (View-Only) (Signed)
 Patient ID: Dennis Fuller, male   DOB: 05/18/1946, 78 y.o.   MRN: [id number]  Chief Complaint  Patient presents with   New Patient (Initial Visit)    RGENT np. ABI + consult. claudication + palor to foot + cold foot + intermittent leg pain. meyer, ashley.       HPI Dennis Fuller is a 78 y.o. male.  I am asked to see the patient by Rosina Poisson for evaluation of severe pain of the right lower extremity.  The patient had been having significant claudication symptoms now for several months.  Both legs were affected but the right leg was the predominantly affected leg.  A couple of weeks ago, he had abrupt worsening of the right leg pain.  He has been unable to sleep for several days.  He has no open wounds or infection.  No fever or chills.  He says the left leg is basically stopped bothering him.  He was started on Trental by his primary care physician for concerns for ischemia but has only taken that a few days and has not had significant improvement.  He does continue to smoke.  The pain has intensified and he essentially has to keep his right foot dependent at all times or it is painful.  Noninvasive studies were performed today to evaluate the perfusion.  His right ABI 0.00 and his left ABI 0.69.  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     Past Medical History:  Diagnosis Date   COPD (chronic obstructive pulmonary disease) (HCC)    Hypertension     Past Surgical History:  Procedure Laterality Date   REVERSE SHOULDER ARTHROPLASTY Right 05/20/2023   Procedure: REVERSE SHOULDER ARTHROPLASTY WITH BICEPS TENODESIS.;  Surgeon: Edie Norleen PARAS, MD;  Location: [facility] ORS;  Service: Orthopedics;  Laterality: Right;  MAKE 2ND CASE    Family History No bleeding disorders, clotting disorders, autoimmune diseases or aneurysms   Social History   Tobacco Use   Smoking status: Every Day    Current packs/day: 0.50    Types: Cigarettes   Smokeless  tobacco: Never  Vaping Use   Vaping status: Never Used  Substance Use Topics   Alcohol use: Yes    Alcohol/week: 70.0 standard drinks of alcohol    Types: 70 Cans of beer per week    Comment: 7-10 beers a day   Drug use: No    Allergies  Allergen Reactions   Hydrochlorothiazide Other (See Comments)    hyponatremia   Codeine     Current Outpatient Medications  Medication Sig Dispense Refill   aspirin 81 MG tablet Take 81 mg by mouth daily.     atorvastatin (LIPITOR) 20 MG tablet Take 10 mg by mouth daily.     calcium carbonate (TUMS EX) 750 MG chewable tablet Chew 3 tablets by mouth as needed for heartburn.     Cholecalciferol 25 MCG (1000 UT) TBDP Take 1 tablet by mouth daily.     folic acid (FOLVITE) 1 MG tablet Take 1 mg by mouth daily.     Ibuprofen (ADVIL) 200 MG CAPS Take 2 capsules by mouth at bedtime.     Ibuprofen-diphenhydrAMINE Cit (ADVIL PM) 200-38 MG TABS Take 1 tablet by mouth at bedtime.     lisinopril (PRINIVIL,ZESTRIL) 40 MG tablet Take 40 mg by mouth daily.     Multiple Vitamins-Minerals (MULTIVITAMIN ADULTS 50+ PO) Take 1 tablet by mouth daily.  NIFEdipine (PROCARDIA XL/NIFEDICAL XL) 60 MG 24 hr tablet Take 60 mg by mouth daily.     pentoxifylline (TRENTAL) 400 MG CR tablet Take 400 mg by mouth.     sennosides-docusate sodium (SENOKOT-S) 8.6-50 MG tablet Take 1 tablet by mouth 2 (two) times daily.     Tiotropium Bromide Monohydrate (SPIRIVA RESPIMAT) 2.5 MCG/ACT AERS Inhale 2 Inhalations into the lungs daily.     oxyCODONE  (ROXICODONE ) 5 MG immediate release tablet Take 1-2 tablets (5-10 mg total) by mouth every 4 (four) hours as needed for moderate pain or severe pain. (Patient not taking: Reported on 05/19/2024) 30 tablet 0   polyethylene glycol (MIRALAX / GLYCOLAX) 17 g packet Take 8.5 g by mouth daily. 1 tsp in coffee daily (Patient not taking: Reported on 05/19/2024)     No current facility-administered medications for this visit.      REVIEW OF  SYSTEMS (Negative unless checked)  Constitutional: [] Weight loss  [] Fever  [] Chills Cardiac: [] Chest pain   [] Chest pressure   [] Palpitations   [] Shortness of breath when laying flat   [] Shortness of breath at rest   [] Shortness of breath with exertion. Vascular:  [x] Pain in legs with walking   [] Pain in legs at rest   [] Pain in legs when laying flat   [x] Claudication   [] Pain in feet when walking  [x] Pain in feet at rest  [x] Pain in feet when laying flat   [] History of DVT   [] Phlebitis   [] Swelling in legs   [] Varicose veins   [] Non-healing ulcers Pulmonary:   [] Uses home oxygen   [] Productive cough   [] Hemoptysis   [] Wheeze  [x] COPD   [] Asthma Neurologic:  [] Dizziness  [] Blackouts   [] Seizures   [] History of stroke   [] History of TIA  [] Aphasia   [] Temporary blindness   [] Dysphagia   [] Weakness or numbness in arms   [] Weakness or numbness in legs Musculoskeletal:  [x] Arthritis   [] Joint swelling   [x] Joint pain   [] Low back pain Hematologic:  [] Easy bruising  [] Easy bleeding   [] Hypercoagulable state   [] Anemic  [] Hepatitis Gastrointestinal:  [] Blood in stool   [] Vomiting blood  [] Gastroesophageal reflux/heartburn   [] Abdominal pain Genitourinary:  [] Chronic kidney disease   [] Difficult urination  [] Frequent urination  [] Burning with urination   [] Hematuria Skin:  [] Rashes   [] Ulcers   [] Wounds Psychological:  [] History of anxiety   []  History of major depression.    Physical Exam BP 128/74   Pulse 84   Ht 5' 9 (1.753 m)   Wt 156 lb 6.4 oz (70.9 kg)   BMI 23.10 kg/m  Gen:  WD/WN, NAD Head: /AT, No temporalis wasting.  Ear/Nose/Throat: Hearing grossly intact, nares w/o erythema or drainage, oropharynx w/o Erythema/Exudate Eyes: Conjunctiva clear, sclera non-icteric  Neck: trachea midline.  No JVD.  Pulmonary:  Good air movement, respirations not labored, no use of accessory muscles  Cardiac: RRR, no JVD Vascular:  Vessel Right Left  Radial Palpable Palpable                           DP NP NP  PT NP 1+   Gastrointestinal:. No masses, surgical incisions, or scars. Musculoskeletal: M/S 5/5 throughout.  Right leg has muscular atrophy and is somewhat cool to the touch.  Mild cyanosis with dependency.  Trace left lower extremity edema.  Significant Neurologic: Right foot sensation is present but diminished.  Symmetrical.  Speech is fluent. Motor exam as listed above.  Psychiatric: Judgment intact, Mood & affect appropriate for pt's clinical situation. Dermatologic: No rashes or ulcers noted.  No cellulitis or open wounds.    Radiology No results found.  Labs No results found for this or any previous visit (from the past 2160 hours).  Assessment/Plan:  Atherosclerosis of native arteries of extremity with rest pain (HCC) Noninvasive studies were performed today to evaluate the perfusion.  His right ABI 0.00 and his left ABI 0.69.  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   This is clearly a critical and limb threatening situation on the right leg.  His left leg has claudication but no acute limb threatening issues. I have discussed the grave nature of the situation.  I discussed that even with revascularization, limb loss is possible.  It is hard to say how symptomatic his left leg is due to the severe pain in his right leg now, but this may need to be addressed in the future.  The left leg is not acutely limb threatening. I have recommended he begin Plavix  in addition to his aspirin and Lipitor.  He can do this instead of Trental.  I will give him some narcotic pain medication for the next couple of days and we will get him on the angiogram schedule as soon as possible.  I have discussed the procedure in detail.  I discussed the risks and benefits of the procedure.  I have also discussed that there is a significant chance he will need to stay overnight after the procedure and also a significant chance that endovascular  revascularization will not be an option due to the severity of the disease.  They voiced their understanding and are agreeable to proceed.  Hypertension blood pressure control important in reducing the progression of atherosclerotic disease. On appropriate oral medications.   Tobacco use disorder This represents a primary atherosclerotic risk factor for him.  Any intervention we perform the durability will be diminished by tobacco as well.  Smoking cessation would be recommended.      Selinda Gu 05/19/2024, 9:45 AM   This note was created with Dragon medical transcription system.  Any errors from dictation are unintentional.

## 2024-05-19 NOTE — Assessment & Plan Note (Signed)
 This represents a primary atherosclerotic risk factor for him.  Any intervention we perform the durability will be diminished by tobacco as well.  Smoking cessation would be recommended.

## 2024-05-19 NOTE — Telephone Encounter (Signed)
 Spoke with the patient and he is scheduled with Dr. Marea for a RLE angio on 05/22/24 with a 6:45 am arrival time to the [facility]. Pre-procedure instructions were discussed and will be sent to Mychart.

## 2024-05-19 NOTE — Assessment & Plan Note (Signed)
 blood pressure control important in reducing the progression of atherosclerotic disease. On appropriate oral medications.

## 2024-05-19 NOTE — Progress Notes (Signed)
 Patient ID: Dennis Fuller, male   DOB: 05/18/1946, 78 y.o.   MRN: [id number]  Chief Complaint  Patient presents with   New Patient (Initial Visit)    RGENT np. ABI + consult. claudication + palor to foot + cold foot + intermittent leg pain. meyer, ashley.       HPI Dennis Fuller is a 78 y.o. male.  I am asked to see the patient by Rosina Poisson for evaluation of severe pain of the right lower extremity.  The patient had been having significant claudication symptoms now for several months.  Both legs were affected but the right leg was the predominantly affected leg.  A couple of weeks ago, he had abrupt worsening of the right leg pain.  He has been unable to sleep for several days.  He has no open wounds or infection.  No fever or chills.  He says the left leg is basically stopped bothering him.  He was started on Trental by his primary care physician for concerns for ischemia but has only taken that a few days and has not had significant improvement.  He does continue to smoke.  The pain has intensified and he essentially has to keep his right foot dependent at all times or it is painful.  Noninvasive studies were performed today to evaluate the perfusion.  His right ABI 0.00 and his left ABI 0.69.  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     Past Medical History:  Diagnosis Date   COPD (chronic obstructive pulmonary disease) (HCC)    Hypertension     Past Surgical History:  Procedure Laterality Date   REVERSE SHOULDER ARTHROPLASTY Right 05/20/2023   Procedure: REVERSE SHOULDER ARTHROPLASTY WITH BICEPS TENODESIS.;  Surgeon: Edie Norleen PARAS, MD;  Location: [facility] ORS;  Service: Orthopedics;  Laterality: Right;  MAKE 2ND CASE    Family History No bleeding disorders, clotting disorders, autoimmune diseases or aneurysms   Social History   Tobacco Use   Smoking status: Every Day    Current packs/day: 0.50    Types: Cigarettes   Smokeless  tobacco: Never  Vaping Use   Vaping status: Never Used  Substance Use Topics   Alcohol use: Yes    Alcohol/week: 70.0 standard drinks of alcohol    Types: 70 Cans of beer per week    Comment: 7-10 beers a day   Drug use: No    Allergies  Allergen Reactions   Hydrochlorothiazide Other (See Comments)    hyponatremia   Codeine     Current Outpatient Medications  Medication Sig Dispense Refill   aspirin 81 MG tablet Take 81 mg by mouth daily.     atorvastatin (LIPITOR) 20 MG tablet Take 10 mg by mouth daily.     calcium carbonate (TUMS EX) 750 MG chewable tablet Chew 3 tablets by mouth as needed for heartburn.     Cholecalciferol 25 MCG (1000 UT) TBDP Take 1 tablet by mouth daily.     folic acid (FOLVITE) 1 MG tablet Take 1 mg by mouth daily.     Ibuprofen (ADVIL) 200 MG CAPS Take 2 capsules by mouth at bedtime.     Ibuprofen-diphenhydrAMINE Cit (ADVIL PM) 200-38 MG TABS Take 1 tablet by mouth at bedtime.     lisinopril (PRINIVIL,ZESTRIL) 40 MG tablet Take 40 mg by mouth daily.     Multiple Vitamins-Minerals (MULTIVITAMIN ADULTS 50+ PO) Take 1 tablet by mouth daily.  NIFEdipine (PROCARDIA XL/NIFEDICAL XL) 60 MG 24 hr tablet Take 60 mg by mouth daily.     pentoxifylline (TRENTAL) 400 MG CR tablet Take 400 mg by mouth.     sennosides-docusate sodium (SENOKOT-S) 8.6-50 MG tablet Take 1 tablet by mouth 2 (two) times daily.     Tiotropium Bromide Monohydrate (SPIRIVA RESPIMAT) 2.5 MCG/ACT AERS Inhale 2 Inhalations into the lungs daily.     oxyCODONE  (ROXICODONE ) 5 MG immediate release tablet Take 1-2 tablets (5-10 mg total) by mouth every 4 (four) hours as needed for moderate pain or severe pain. (Patient not taking: Reported on 05/19/2024) 30 tablet 0   polyethylene glycol (MIRALAX / GLYCOLAX) 17 g packet Take 8.5 g by mouth daily. 1 tsp in coffee daily (Patient not taking: Reported on 05/19/2024)     No current facility-administered medications for this visit.      REVIEW OF  SYSTEMS (Negative unless checked)  Constitutional: [] Weight loss  [] Fever  [] Chills Cardiac: [] Chest pain   [] Chest pressure   [] Palpitations   [] Shortness of breath when laying flat   [] Shortness of breath at rest   [] Shortness of breath with exertion. Vascular:  [x] Pain in legs with walking   [] Pain in legs at rest   [] Pain in legs when laying flat   [x] Claudication   [] Pain in feet when walking  [x] Pain in feet at rest  [x] Pain in feet when laying flat   [] History of DVT   [] Phlebitis   [] Swelling in legs   [] Varicose veins   [] Non-healing ulcers Pulmonary:   [] Uses home oxygen   [] Productive cough   [] Hemoptysis   [] Wheeze  [x] COPD   [] Asthma Neurologic:  [] Dizziness  [] Blackouts   [] Seizures   [] History of stroke   [] History of TIA  [] Aphasia   [] Temporary blindness   [] Dysphagia   [] Weakness or numbness in arms   [] Weakness or numbness in legs Musculoskeletal:  [x] Arthritis   [] Joint swelling   [x] Joint pain   [] Low back pain Hematologic:  [] Easy bruising  [] Easy bleeding   [] Hypercoagulable state   [] Anemic  [] Hepatitis Gastrointestinal:  [] Blood in stool   [] Vomiting blood  [] Gastroesophageal reflux/heartburn   [] Abdominal pain Genitourinary:  [] Chronic kidney disease   [] Difficult urination  [] Frequent urination  [] Burning with urination   [] Hematuria Skin:  [] Rashes   [] Ulcers   [] Wounds Psychological:  [] History of anxiety   []  History of major depression.    Physical Exam BP 128/74   Pulse 84   Ht 5' 9 (1.753 m)   Wt 156 lb 6.4 oz (70.9 kg)   BMI 23.10 kg/m  Gen:  WD/WN, NAD Head: /AT, No temporalis wasting.  Ear/Nose/Throat: Hearing grossly intact, nares w/o erythema or drainage, oropharynx w/o Erythema/Exudate Eyes: Conjunctiva clear, sclera non-icteric  Neck: trachea midline.  No JVD.  Pulmonary:  Good air movement, respirations not labored, no use of accessory muscles  Cardiac: RRR, no JVD Vascular:  Vessel Right Left  Radial Palpable Palpable                           DP NP NP  PT NP 1+   Gastrointestinal:. No masses, surgical incisions, or scars. Musculoskeletal: M/S 5/5 throughout.  Right leg has muscular atrophy and is somewhat cool to the touch.  Mild cyanosis with dependency.  Trace left lower extremity edema.  Significant Neurologic: Right foot sensation is present but diminished.  Symmetrical.  Speech is fluent. Motor exam as listed above.  Psychiatric: Judgment intact, Mood & affect appropriate for pt's clinical situation. Dermatologic: No rashes or ulcers noted.  No cellulitis or open wounds.    Radiology No results found.  Labs No results found for this or any previous visit (from the past 2160 hours).  Assessment/Plan:  Atherosclerosis of native arteries of extremity with rest pain (HCC) Noninvasive studies were performed today to evaluate the perfusion.  His right ABI 0.00 and his left ABI 0.69.  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   This is clearly a critical and limb threatening situation on the right leg.  His left leg has claudication but no acute limb threatening issues. I have discussed the grave nature of the situation.  I discussed that even with revascularization, limb loss is possible.  It is hard to say how symptomatic his left leg is due to the severe pain in his right leg now, but this may need to be addressed in the future.  The left leg is not acutely limb threatening. I have recommended he begin Plavix  in addition to his aspirin and Lipitor.  He can do this instead of Trental.  I will give him some narcotic pain medication for the next couple of days and we will get him on the angiogram schedule as soon as possible.  I have discussed the procedure in detail.  I discussed the risks and benefits of the procedure.  I have also discussed that there is a significant chance he will need to stay overnight after the procedure and also a significant chance that endovascular  revascularization will not be an option due to the severity of the disease.  They voiced their understanding and are agreeable to proceed.  Hypertension blood pressure control important in reducing the progression of atherosclerotic disease. On appropriate oral medications.   Tobacco use disorder This represents a primary atherosclerotic risk factor for him.  Any intervention we perform the durability will be diminished by tobacco as well.  Smoking cessation would be recommended.      Selinda Gu 05/19/2024, 9:45 AM   This note was created with Dragon medical transcription system.  Any errors from dictation are unintentional.

## 2024-05-22 ENCOUNTER — Other Ambulatory Visit: Payer: Self-pay

## 2024-05-22 ENCOUNTER — Inpatient Hospital Stay
Admission: RE | Admit: 2024-05-22 | Discharge: 2024-05-26 | DRG: 271 | Disposition: A | Discharge status: Home Health | Attending: Vascular Surgery | Admitting: Vascular Surgery

## 2024-05-22 ENCOUNTER — Encounter
Admission: RE | Disposition: A | Discharge status: Home Health | Payer: Self-pay | Source: Home / Self Care | Attending: Vascular Surgery

## 2024-05-22 ENCOUNTER — Encounter: Payer: Self-pay | Admitting: Vascular Surgery

## 2024-05-22 DIAGNOSIS — L7632 Postprocedural hematoma of skin and subcutaneous tissue following other procedure: Secondary | ICD-10-CM | POA: Diagnosis not present

## 2024-05-22 DIAGNOSIS — Z7982 Long term (current) use of aspirin: Secondary | ICD-10-CM

## 2024-05-22 DIAGNOSIS — Z96611 Presence of right artificial shoulder joint: Secondary | ICD-10-CM | POA: Diagnosis present

## 2024-05-22 DIAGNOSIS — F1010 Alcohol abuse, uncomplicated: Secondary | ICD-10-CM | POA: Diagnosis present

## 2024-05-22 DIAGNOSIS — I724 Aneurysm of artery of lower extremity: Secondary | ICD-10-CM | POA: Diagnosis not present

## 2024-05-22 DIAGNOSIS — I70221 Atherosclerosis of native arteries of extremities with rest pain, right leg: Secondary | ICD-10-CM | POA: Diagnosis present

## 2024-05-22 DIAGNOSIS — I728 Aneurysm of other specified arteries: Secondary | ICD-10-CM | POA: Diagnosis present

## 2024-05-22 DIAGNOSIS — I771 Stricture of artery: Secondary | ICD-10-CM | POA: Diagnosis present

## 2024-05-22 DIAGNOSIS — Z9889 Other specified postprocedural states: Secondary | ICD-10-CM

## 2024-05-22 DIAGNOSIS — I743 Embolism and thrombosis of arteries of the lower extremities: Secondary | ICD-10-CM | POA: Diagnosis present

## 2024-05-22 DIAGNOSIS — S301XXA Contusion of abdominal wall, initial encounter: Secondary | ICD-10-CM

## 2024-05-22 DIAGNOSIS — I700 Atherosclerosis of aorta: Secondary | ICD-10-CM

## 2024-05-22 DIAGNOSIS — I70229 Atherosclerosis of native arteries of extremities with rest pain, unspecified extremity: Secondary | ICD-10-CM | POA: Diagnosis present

## 2024-05-22 DIAGNOSIS — Z791 Long term (current) use of non-steroidal anti-inflammatories (NSAID): Secondary | ICD-10-CM

## 2024-05-22 DIAGNOSIS — D62 Acute posthemorrhagic anemia: Secondary | ICD-10-CM | POA: Diagnosis not present

## 2024-05-22 DIAGNOSIS — F10139 Alcohol abuse with withdrawal, unspecified: Secondary | ICD-10-CM | POA: Diagnosis present

## 2024-05-22 DIAGNOSIS — J449 Chronic obstructive pulmonary disease, unspecified: Secondary | ICD-10-CM | POA: Diagnosis present

## 2024-05-22 DIAGNOSIS — Z79899 Other long term (current) drug therapy: Secondary | ICD-10-CM

## 2024-05-22 DIAGNOSIS — I998 Other disorder of circulatory system: Secondary | ICD-10-CM | POA: Diagnosis not present

## 2024-05-22 DIAGNOSIS — Y838 Other surgical procedures as the cause of abnormal reaction of the patient, or of later complication, without mention of misadventure at the time of the procedure: Secondary | ICD-10-CM | POA: Diagnosis not present

## 2024-05-22 DIAGNOSIS — Z7902 Long term (current) use of antithrombotics/antiplatelets: Secondary | ICD-10-CM | POA: Diagnosis not present

## 2024-05-22 DIAGNOSIS — F17210 Nicotine dependence, cigarettes, uncomplicated: Secondary | ICD-10-CM | POA: Diagnosis present

## 2024-05-22 DIAGNOSIS — E785 Hyperlipidemia, unspecified: Secondary | ICD-10-CM | POA: Diagnosis present

## 2024-05-22 DIAGNOSIS — I97418 Intraoperative hemorrhage and hematoma of a circulatory system organ or structure complicating other circulatory system procedure: Secondary | ICD-10-CM | POA: Diagnosis not present

## 2024-05-22 DIAGNOSIS — I10 Essential (primary) hypertension: Secondary | ICD-10-CM | POA: Diagnosis present

## 2024-05-22 DIAGNOSIS — I70202 Unspecified atherosclerosis of native arteries of extremities, left leg: Secondary | ICD-10-CM

## 2024-05-22 HISTORY — DX: Puncture wound without foreign body of unspecified front wall of thorax without penetration into thoracic cavity, initial encounter: S21.139A

## 2024-05-22 HISTORY — PX: LOWER EXTREMITY ANGIOGRAPHY: CATH118251

## 2024-05-22 LAB — CBC
HCT: 36.8 % — ABNORMAL LOW (ref 39.0–52.0)
HCT: 35.2 % — ABNORMAL LOW (ref 39.0–52.0)
HCT: 32.9 % — ABNORMAL LOW (ref 39.0–52.0)
Hemoglobin: 12.6 g/dL — ABNORMAL LOW (ref 13.0–17.0)
Hemoglobin: 12.1 g/dL — ABNORMAL LOW (ref 13.0–17.0)
Hemoglobin: 11.5 g/dL — ABNORMAL LOW (ref 13.0–17.0)
MCH: 27.7 pg (ref 26.0–34.0)
MCH: 28 pg (ref 26.0–34.0)
MCH: 27.9 pg (ref 26.0–34.0)
MCHC: 34.2 g/dL (ref 30.0–36.0)
MCHC: 34.4 g/dL (ref 30.0–36.0)
MCHC: 35 g/dL (ref 30.0–36.0)
MCV: 80.9 fL (ref 80.0–100.0)
MCV: 81.5 fL (ref 80.0–100.0)
MCV: 79.9 fL — ABNORMAL LOW (ref 80.0–100.0)
Platelets: 252 K/uL (ref 150–400)
Platelets: 230 K/uL (ref 150–400)
Platelets: 207 K/uL (ref 150–400)
RBC: 4.55 MIL/uL (ref 4.22–5.81)
RBC: 4.32 MIL/uL (ref 4.22–5.81)
RBC: 4.12 MIL/uL — ABNORMAL LOW (ref 4.22–5.81)
RDW: 15.4 % (ref 11.5–15.5)
RDW: 15.6 % — ABNORMAL HIGH (ref 11.5–15.5)
RDW: 15.3 % (ref 11.5–15.5)
WBC: 9.3 K/uL (ref 4.0–10.5)
WBC: 9.4 K/uL (ref 4.0–10.5)
WBC: 9.8 K/uL (ref 4.0–10.5)
nRBC: 0 % (ref 0.0–0.2)
nRBC: 0 % (ref 0.0–0.2)
nRBC: 0 % (ref 0.0–0.2)

## 2024-05-22 LAB — VAS US ABI WITH/WO TBI
Left ABI: 0.69
Right ABI: 0

## 2024-05-22 LAB — CREATININE, SERUM
Creatinine, Ser: 0.9 mg/dL (ref 0.61–1.24)
GFR, Estimated: >60 mL/min (ref >60)

## 2024-05-22 LAB — GLUCOSE, CAPILLARY: Glucose-Capillary: 104 mg/dL — ABNORMAL HIGH (ref 70–99)

## 2024-05-22 LAB — FIBRINOGEN
Fibrinogen: 278 mg/dL (ref 210–475)
Fibrinogen: 282 mg/dL (ref 210–475)

## 2024-05-22 LAB — HEPARIN LEVEL (UNFRACTIONATED)
Heparin Unfractionated: 1.02 [IU]/mL — ABNORMAL HIGH (ref 0.30–0.70)
Heparin Unfractionated: 0.17 [IU]/mL — ABNORMAL LOW (ref 0.30–0.70)

## 2024-05-22 LAB — BUN: BUN: 11 mg/dL (ref 8–23)

## 2024-05-22 LAB — MRSA NEXT GEN BY PCR, NASAL: MRSA by PCR Next Gen: NOT DETECTED

## 2024-05-22 SURGERY — LOWER EXTREMITY ANGIOGRAPHY
Anesthesia: Moderate Sedation | Site: Leg Lower | Laterality: Right

## 2024-05-22 MED ORDER — LOPERAMIDE HCL 2 MG PO CAPS: 2.0000 mg | ORAL_CAPSULE | ORAL | Status: DC | PRN

## 2024-05-22 MED ORDER — SODIUM CHLORIDE 0.9 % IV SOLN
0.5000 mg/h | INTRAVENOUS | Status: DC
  Administered 2024-05-23: 0.5 mg/h
  Filled 2024-05-22 (×2): qty 10

## 2024-05-22 MED ORDER — ORAL CARE MOUTH RINSE: 15.0000 mL | OROMUCOSAL | Status: DC | PRN

## 2024-05-22 MED ORDER — ATORVASTATIN CALCIUM 10 MG PO TABS
10.0000 mg | ORAL_TABLET | Freq: Every day | ORAL | Status: DC
  Administered 2024-05-23 – 2024-05-26 (×4): 10 mg via ORAL
  Filled 2024-05-22 (×4): qty 1

## 2024-05-22 MED ORDER — SODIUM CHLORIDE 0.9 % IV SOLN: 250.0000 mL | INTRAVENOUS | Status: AC | PRN

## 2024-05-22 MED ORDER — OXYCODONE HCL 5 MG PO TABS
5.0000 mg | ORAL_TABLET | ORAL | Status: DC | PRN
  Administered 2024-05-22 (×3): 5 mg via ORAL
  Administered 2024-05-23: 10 mg via ORAL
  Administered 2024-05-23: 5 mg via ORAL
  Administered 2024-05-23: 10 mg via ORAL
  Administered 2024-05-23: 5 mg via ORAL
  Administered 2024-05-24 – 2024-05-25 (×2): 10 mg via ORAL
  Filled 2024-05-22 (×2): qty 1
  Filled 2024-05-22 (×2): qty 2
  Filled 2024-05-22: qty 1
  Filled 2024-05-22 (×2): qty 2
  Filled 2024-05-22 (×2): qty 1

## 2024-05-22 MED ORDER — SODIUM CHLORIDE 0.9 % IV SOLN
1.0000 mg/h | INTRAVENOUS | Status: AC
  Administered 2024-05-22: 1 mg/h
  Filled 2024-05-22: qty 10

## 2024-05-22 MED ORDER — CHLORDIAZEPOXIDE HCL 25 MG PO CAPS
25.0000 mg | ORAL_CAPSULE | Freq: Three times a day (TID) | ORAL | Status: AC
  Administered 2024-05-23 – 2024-05-24 (×3): 25 mg via ORAL
  Filled 2024-05-22 (×3): qty 1

## 2024-05-22 MED ORDER — MIDAZOLAM HCL 2 MG/ML PO SYRP: 8.0000 mg | ORAL_SOLUTION | Freq: Once | ORAL | Status: DC | PRN

## 2024-05-22 MED ORDER — NIFEDIPINE ER 60 MG PO TB24
60.0000 mg | ORAL_TABLET | Freq: Every day | ORAL | Status: DC
  Administered 2024-05-23 – 2024-05-26 (×4): 60 mg via ORAL
  Filled 2024-05-22 (×4): qty 1

## 2024-05-22 MED ORDER — FOLIC ACID 1 MG PO TABS
1.0000 mg | ORAL_TABLET | Freq: Every day | ORAL | Status: DC
Start: 2024-05-23 — End: 2024-05-22

## 2024-05-22 MED ORDER — ONDANSETRON HCL 4 MG/2ML IJ SOLN
INTRAMUSCULAR | Status: AC
  Administered 2024-05-22: 4 mg via INTRAVENOUS
  Filled 2024-05-22: qty 2

## 2024-05-22 MED ORDER — ONDANSETRON 4 MG PO TBDP: 4.0000 mg | ORAL_TABLET | Freq: Four times a day (QID) | ORAL | Status: DC | PRN

## 2024-05-22 MED ORDER — CEFAZOLIN SODIUM-DEXTROSE 2-4 GM/100ML-% IV SOLN
2.0000 g | INTRAVENOUS | Status: AC
  Administered 2024-05-22: 2 g via INTRAVENOUS

## 2024-05-22 MED ORDER — SPIRITUS FRUMENTI
1.0000 | Freq: Three times a day (TID) | ORAL | Status: DC | PRN
  Administered 2024-05-23 – 2024-05-26 (×4): 1 via ORAL
  Filled 2024-05-22 (×4): qty 1

## 2024-05-22 MED ORDER — SPIRITUS FRUMENTI
1.0000 | Freq: Three times a day (TID) | ORAL | Status: DC
  Filled 2024-05-22 (×2): qty 1

## 2024-05-22 MED ORDER — CHLORDIAZEPOXIDE HCL 25 MG PO CAPS: 25.0000 mg | ORAL_CAPSULE | Freq: Four times a day (QID) | ORAL | Status: DC | PRN

## 2024-05-22 MED ORDER — HYDROMORPHONE HCL 1 MG/ML IJ SOLN
INTRAMUSCULAR | Status: AC
  Filled 2024-05-22: qty 1

## 2024-05-22 MED ORDER — MIDAZOLAM HCL 5 MG/5ML IJ SOLN
INTRAMUSCULAR | Status: AC
  Filled 2024-05-22: qty 5

## 2024-05-22 MED ORDER — HEPARIN SODIUM (PORCINE) 1000 UNIT/ML IJ SOLN
INTRAMUSCULAR | Status: AC
  Filled 2024-05-22: qty 10

## 2024-05-22 MED ORDER — HEPARIN (PORCINE) IN NACL 2000-0.9 UNIT/L-% IV SOLN
INTRAVENOUS | Status: DC | PRN
  Administered 2024-05-22: 1000 mL

## 2024-05-22 MED ORDER — HEPARIN (PORCINE) IN NACL 1000-0.9 UT/500ML-% IV SOLN
INTRAVENOUS | Status: DC | PRN
  Administered 2024-05-22: 1000 mL

## 2024-05-22 MED ORDER — ALTEPLASE 2 MG IJ SOLR
INTRAMUSCULAR | Status: AC
Start: 2024-05-22 — End: 2024-05-22
  Filled 2024-05-22: qty 8

## 2024-05-22 MED ORDER — IBUPROFEN 400 MG PO TABS
200.0000 mg | ORAL_TABLET | Freq: Every day | ORAL | Status: DC
  Administered 2024-05-22 – 2024-05-25 (×4): 200 mg via ORAL
  Filled 2024-05-22 (×4): qty 1

## 2024-05-22 MED ORDER — HEPARIN SODIUM (PORCINE) 1000 UNIT/ML IJ SOLN
INTRAMUSCULAR | Status: DC | PRN
  Administered 2024-05-22: 6000 [IU] via INTRAVENOUS

## 2024-05-22 MED ORDER — FENTANYL CITRATE (PF) 100 MCG/2ML IJ SOLN
INTRAMUSCULAR | Status: DC | PRN
  Administered 2024-05-22 (×2): 25 ug via INTRAVENOUS
  Administered 2024-05-22: 50 ug via INTRAVENOUS

## 2024-05-22 MED ORDER — CHLORDIAZEPOXIDE HCL 25 MG PO CAPS
25.0000 mg | ORAL_CAPSULE | ORAL | Status: AC
  Administered 2024-05-24 – 2024-05-25 (×2): 25 mg via ORAL
  Filled 2024-05-22 (×2): qty 1

## 2024-05-22 MED ORDER — CALCIUM CARBONATE ANTACID 500 MG PO CHEW: 3.0000 | CHEWABLE_TABLET | ORAL | Status: DC | PRN

## 2024-05-22 MED ORDER — LIDOCAINE-EPINEPHRINE (PF) 1 %-1:200000 IJ SOLN
INTRAMUSCULAR | Status: DC | PRN
  Administered 2024-05-22: 10 mL

## 2024-05-22 MED ORDER — FAMOTIDINE 20 MG PO TABS: 40.0000 mg | ORAL_TABLET | Freq: Once | ORAL | Status: DC | PRN

## 2024-05-22 MED ORDER — ASPIRIN 81 MG PO TBEC
81.0000 mg | DELAYED_RELEASE_TABLET | Freq: Every day | ORAL | Status: DC
  Administered 2024-05-23 – 2024-05-26 (×4): 81 mg via ORAL
  Filled 2024-05-22 (×4): qty 1

## 2024-05-22 MED ORDER — FENTANYL CITRATE (PF) 100 MCG/2ML IJ SOLN
INTRAMUSCULAR | Status: AC
  Filled 2024-05-22: qty 2

## 2024-05-22 MED ORDER — IODIXANOL 320 MG/ML IV SOLN
INTRAVENOUS | Status: DC | PRN
  Administered 2024-05-22: 50 mL

## 2024-05-22 MED ORDER — ONDANSETRON HCL 4 MG/2ML IJ SOLN
4.0000 mg | Freq: Four times a day (QID) | INTRAMUSCULAR | Status: DC | PRN
  Administered 2024-05-23: 4 mg via INTRAVENOUS
  Filled 2024-05-22: qty 2

## 2024-05-22 MED ORDER — MIDAZOLAM HCL 2 MG/2ML IJ SOLN
INTRAMUSCULAR | Status: DC | PRN
  Administered 2024-05-22 (×2): 1 mg via INTRAVENOUS
  Administered 2024-05-22: 2 mg via INTRAVENOUS
  Administered 2024-05-22: 1 mg via INTRAVENOUS

## 2024-05-22 MED ORDER — HYDROXYZINE HCL 25 MG PO TABS: 25.0000 mg | ORAL_TABLET | Freq: Four times a day (QID) | ORAL | Status: DC | PRN

## 2024-05-22 MED ORDER — HYDROMORPHONE HCL 1 MG/ML IJ SOLN
INTRAMUSCULAR | Status: AC
  Administered 2024-05-22: 1 mg via INTRAVENOUS
  Filled 2024-05-22: qty 1

## 2024-05-22 MED ORDER — FOLIC ACID 1 MG PO TABS
1.0000 mg | ORAL_TABLET | Freq: Every day | ORAL | Status: DC
  Administered 2024-05-22 – 2024-05-26 (×5): 1 mg via ORAL
  Filled 2024-05-22 (×5): qty 1

## 2024-05-22 MED ORDER — NICOTINE 14 MG/24HR TD PT24
14.0000 mg | MEDICATED_PATCH | Freq: Every day | TRANSDERMAL | Status: DC
  Administered 2024-05-22 – 2024-05-25 (×4): 14 mg via TRANSDERMAL
  Filled 2024-05-22 (×5): qty 1

## 2024-05-22 MED ORDER — ALTEPLASE 2 MG IJ SOLR
INTRAMUSCULAR | Status: DC | PRN
  Administered 2024-05-22: 8 mg

## 2024-05-22 MED ORDER — SODIUM CHLORIDE 0.9% FLUSH
3.0000 mL | Freq: Two times a day (BID) | INTRAVENOUS | Status: DC
  Administered 2024-05-22 – 2024-05-25 (×6): 3 mL via INTRAVENOUS

## 2024-05-22 MED ORDER — DIPHENHYDRAMINE HCL 50 MG/ML IJ SOLN: 50.0000 mg | Freq: Once | INTRAMUSCULAR | Status: DC | PRN

## 2024-05-22 MED ORDER — HYDROMORPHONE HCL 1 MG/ML IJ SOLN
1.0000 mg | Freq: Once | INTRAMUSCULAR | Status: AC
  Administered 2024-05-22: 1 mg via INTRAVENOUS
  Administered 2024-05-23: 0.5 mg via INTRAVENOUS
  Filled 2024-05-22: qty 1

## 2024-05-22 MED ORDER — DIPHENHYDRAMINE HCL 25 MG PO CAPS
25.0000 mg | ORAL_CAPSULE | Freq: Every day | ORAL | Status: DC
  Administered 2024-05-22 – 2024-05-25 (×4): 25 mg via ORAL
  Filled 2024-05-22 (×4): qty 1

## 2024-05-22 MED ORDER — ADULT MULTIVITAMIN W/MINERALS CH: 1.0000 | ORAL_TABLET | Freq: Every day | ORAL | Status: DC

## 2024-05-22 MED ORDER — LISINOPRIL 20 MG PO TABS
40.0000 mg | ORAL_TABLET | Freq: Every day | ORAL | Status: DC
  Administered 2024-05-23 – 2024-05-24 (×2): 40 mg via ORAL
  Filled 2024-05-22: qty 4
  Filled 2024-05-22 (×3): qty 2

## 2024-05-22 MED ORDER — CHLORDIAZEPOXIDE HCL 25 MG PO CAPS
25.0000 mg | ORAL_CAPSULE | Freq: Four times a day (QID) | ORAL | Status: AC
  Administered 2024-05-22 – 2024-05-23 (×4): 25 mg via ORAL
  Filled 2024-05-22 (×4): qty 1

## 2024-05-22 MED ORDER — SODIUM CHLORIDE 0.9% FLUSH: 3.0000 mL | INTRAVENOUS | Status: DC | PRN

## 2024-05-22 MED ORDER — UMECLIDINIUM BROMIDE 62.5 MCG/ACT IN AEPB
1.0000 | INHALATION_SPRAY | Freq: Every evening | RESPIRATORY_TRACT | Status: DC
  Administered 2024-05-22 – 2024-05-25 (×5): 1 via RESPIRATORY_TRACT
  Filled 2024-05-22: qty 7

## 2024-05-22 MED ORDER — ADULT MULTIVITAMIN W/MINERALS CH
1.0000 | ORAL_TABLET | Freq: Every day | ORAL | Status: DC
  Administered 2024-05-22 – 2024-05-25 (×4): 1 via ORAL
  Filled 2024-05-22 (×4): qty 1

## 2024-05-22 MED ORDER — THIAMINE HCL 100 MG/ML IJ SOLN
500.0000 mg | INTRAMUSCULAR | Status: AC
  Administered 2024-05-22 – 2024-05-24 (×3): 500 mg via INTRAVENOUS
  Filled 2024-05-22 (×3): qty 5

## 2024-05-22 MED ORDER — HEPARIN (PORCINE) 25000 UT/250ML-% IV SOLN
INTRAVENOUS | Status: AC
  Filled 2024-05-22: qty 250

## 2024-05-22 MED ORDER — CEFAZOLIN SODIUM-DEXTROSE 2-4 GM/100ML-% IV SOLN
INTRAVENOUS | Status: AC
Start: 2024-05-22 — End: 2024-05-22
  Filled 2024-05-22: qty 100

## 2024-05-22 MED ORDER — SODIUM CHLORIDE 0.9 % IV SOLN: INTRAVENOUS | Status: DC

## 2024-05-22 MED ORDER — HEPARIN (PORCINE) 25000 UT/250ML-% IV SOLN
600.0000 [IU]/h | INTRAVENOUS | Status: DC
  Administered 2024-05-22: 600 [IU]/h via INTRA_ARTERIAL

## 2024-05-22 MED ORDER — METHYLPREDNISOLONE SODIUM SUCC 125 MG IJ SOLR: 125.0000 mg | Freq: Once | INTRAMUSCULAR | Status: DC | PRN

## 2024-05-22 MED ORDER — IBUPROFEN-DIPHENHYDRAMINE CIT 200-38 MG PO TABS: 1.0000 | ORAL_TABLET | Freq: Every day | ORAL | Status: DC

## 2024-05-22 MED ORDER — SENNOSIDES-DOCUSATE SODIUM 8.6-50 MG PO TABS
1.0000 | ORAL_TABLET | Freq: Two times a day (BID) | ORAL | Status: DC
  Administered 2024-05-22 – 2024-05-26 (×8): 1 via ORAL
  Filled 2024-05-22 (×8): qty 1

## 2024-05-22 MED ORDER — THIAMINE HCL 100 MG/ML IJ SOLN: 100.0000 mg | INTRAMUSCULAR | Status: DC

## 2024-05-22 MED ORDER — HYDROMORPHONE HCL 1 MG/ML IJ SOLN
INTRAMUSCULAR | Status: DC | PRN
  Administered 2024-05-22 (×2): .5 mg via INTRAVENOUS

## 2024-05-22 MED ORDER — HYDROMORPHONE HCL 1 MG/ML IJ SOLN: 1.0000 mg | Freq: Once | INTRAMUSCULAR | Status: AC | PRN

## 2024-05-22 MED ORDER — VITAMIN D 25 MCG (1000 UNIT) PO TABS
1000.0000 [IU] | ORAL_TABLET | Freq: Every day | ORAL | Status: DC
  Administered 2024-05-23 – 2024-05-26 (×4): 1000 [IU] via ORAL
  Filled 2024-05-22 (×4): qty 1

## 2024-05-22 MED ORDER — CHLORDIAZEPOXIDE HCL 25 MG PO CAPS
25.0000 mg | ORAL_CAPSULE | Freq: Every day | ORAL | Status: AC
  Administered 2024-05-25: 25 mg via ORAL
  Filled 2024-05-22: qty 1

## 2024-05-22 SURGICAL SUPPLY — 22 items
BALLOON LUTONIX 018 5X300X130 (BALLOONS) IMPLANT
BALLOON LUTONIX 7X100X130 (BALLOONS) IMPLANT
BALLOON ULTRVRSE 3X220X150 (BALLOONS) IMPLANT
CATH ANGIO 5F PIGTAIL 65CM (CATHETERS) IMPLANT
CATH BEACON 5 .038 100 VERT TP (CATHETERS) IMPLANT
CATH CXI SUPP ANG 4FR 135 (CATHETERS) IMPLANT
CATH INFUS 135X50 (CATHETERS) IMPLANT
CATH ROTAREX 135 6FR (CATHETERS) IMPLANT
CATH TEMPO 5F RIM 65CM (CATHETERS) IMPLANT
CATH VS15FR (CATHETERS) IMPLANT
COVER PROBE ULTRASOUND 5X96 (MISCELLANEOUS) IMPLANT
DEVICE PRESTO INFLATION (MISCELLANEOUS) IMPLANT
GLIDECATH 4FR STR (CATHETERS) IMPLANT
GLIDEWIRE ADV .035X260CM (WIRE) IMPLANT
KIT CV MULTILUMEN 7FR 20 (SET/KITS/TRAYS/PACK) IMPLANT
PACK ANGIOGRAPHY (CUSTOM PROCEDURE TRAY) ×1 IMPLANT
SHEATH ANL2 6FRX45 HC (SHEATH) IMPLANT
SHEATH BRITE TIP 5FRX11 (SHEATH) IMPLANT
SYR MEDRAD MARK 7 150ML (SYRINGE) IMPLANT
TUBING CONTRAST HIGH PRESS 72 (TUBING) IMPLANT
WIRE G V18X300CM (WIRE) IMPLANT
WIRE J 3MM .035X145CM (WIRE) IMPLANT

## 2024-05-22 NOTE — Progress Notes (Signed)
 PHARMACY - ANTICOAGULATION CONSULT NOTE  Pharmacy Consult for heparin  monitoring Indication: VTE prophylaxis  Allergies  Allergen Reactions   Hydrochlorothiazide Other (See Comments)    hyponatremia   Codeine     Patient Measurements: Height: 5' 10 (177.8 cm) Weight: 67.7 kg (149 lb 4 oz) IBW/kg (Calculated) : 73 HEPARIN  DW (KG): 67.7  Vital Signs: Temp: 97.8 F (36.6 C) (09/15 1109) Temp Source: Axillary (09/15 1109) BP: 116/59 (09/15 1300) Pulse Rate: 72 (09/15 1300)  Labs: Recent Labs    05/22/24 0742 05/22/24 1020  HGB  --  12.6*  HCT  --  36.8*  PLT  --  252  HEPARINUNFRC  --  1.02*  CREATININE 0.90  --     Estimated Creatinine Clearance: 65.8 mL/min (by C-G formula based on SCr of 0.9 mg/dL).   Medical History: Past Medical History:  Diagnosis Date   COPD (chronic obstructive pulmonary disease) (HCC)    Hypertension     Medications:  Scheduled:   [START ON 05/23/2024] aspirin  EC  81 mg Oral Daily   [START ON 05/23/2024] atorvastatin   10 mg Oral Daily   chlordiazePOXIDE   25 mg Oral QID   Followed by   [name] ON 05/23/2024] chlordiazePOXIDE   25 mg Oral TID   Followed by   [name] ON 05/24/2024] chlordiazePOXIDE   25 mg Oral BH-qamhs   Followed by   [name] ON 05/25/2024] chlordiazePOXIDE   25 mg Oral Daily   [START ON 05/23/2024] cholecalciferol   1,000 Units Oral Daily   ibuprofen   200 mg Oral QHS   And   diphenhydrAMINE   25 mg Oral QHS   folic acid   1 mg Oral Daily   [START ON 05/23/2024] lisinopril   40 mg Oral Daily   multivitamin with minerals  1 tablet Oral Daily   [START ON 05/23/2024] NIFEdipine   60 mg Oral Daily   senna-docusate  1 tablet Oral BID   sodium chloride  flush  3 mL Intravenous Q12H   [START ON 05/25/2024] thiamine  (VITAMIN B1) injection  100 mg Intravenous Q24H   umeclidinium bromide   1 puff Inhalation QPM   Infusions:   sodium chloride      alteplase  (LIMB ISCHEMIA) 10 mg in normal saline (0.02 mg/mL) infusion 1 mg/hr (05/22/24 1200)    alteplase  (LIMB ISCHEMIA) 10 mg in normal saline (0.02 mg/mL) infusion     heparin  600 Units/hr (05/22/24 1200)   thiamine  (VITAMIN B1) injection     PRN: sodium chloride , calcium  carbonate, chlordiazePOXIDE , hydrOXYzine , loperamide , ondansetron  (ZOFRAN ) IV, ondansetron , mouth rinse, oxyCODONE , sodium chloride  flush, spiritus frumenti  Assessment: 78 y/o male with PMH significant for HTN, PAD and tobacco and alcohol  use disorder. Due to the critical right limb ischemia, the patient was taken for a right lower extremity angiography with Dr. Marea on 05/22/24 to attempt limb salvage. Currently alteplase  (1mg /hr) and heparin  (600 units/hr) are infusing via sheath (both started at approximately 1030 today)  Goal of Therapy:  heparin  level 0.3-0.7 units/ml fibrinogen  < 150 mg/dL Monitor platelets by anticoagulation protocol: Yes   Plan:  ---fibrinogen  ordered q6h: notify MD if < 150 mg/dL ---v3y heparin  level: adjustment via nomogram in consultation w/ vascular surgery ---CBC once daily  Dennis Fuller 05/22/2024,2:18 PM

## 2024-05-22 NOTE — Op Note (Signed)
 [facility]  Percutaneous Study/Intervention Procedural Note   Date of Surgery: 05/22/2024  Surgeon(s):Derryck Shahan    Assistants:none  Pre-operative Diagnosis: PAD with rest pain, acute on chronic ischemia with noninvasive studies demonstrating thrombosis of the right SFA  Post-operative diagnosis:  Same  Procedure(s) Performed:             1.  Ultrasound guidance for vascular access left femoral artery             2.  Catheter placement into right SFA from left femoral approach             3.  Aortogram and selective right lower extremity angiogram             4.  Percutaneous transluminal angioplasty of right common iliac artery with 7 mm diameter Lutonix drug-coated angioplasty balloon             5.  Mechanical thrombectomy of the right SFA, popliteal artery, and tibioperoneal trunk with the [place] Rex device  6.  Angioplasty of the right posterior tibial artery and tibioperoneal trunk with 3 mm diameter angioplasty balloon             7.  Angioplasty of the right SFA and popliteal arteries with 5 mm diameter Lutonix drug-coated angioplasty balloon  8.  Placement of a 135 cm total length 50 cm working length thrombolytic catheter from the right proximal SFA down through the popliteal artery and into the proximal posterior tibial artery and instillation of 8 mg of tPA to begin thrombolytic therapy  9.  Ultrasound guidance for vascular access left femoral vein  10.  Placement of left femoral venous triple-lumen catheter  EBL: 100 cc  Contrast: 50 cc  Fluoro Time: 16.6 minutes  Moderate Conscious Sedation Time: approximately 69 minutes using 5 mg of Versed  and 100 mcg of Fentanyl  and 1 mg of Dilaudid               Indications:  Patient is a 78 y.o.male with a profoundly ischemic right lower extremity over the past couple of weeks. The patient has noninvasive study showing thrombosis in the right SFA and popliteal arteries with an undetectable ABI distally. The  patient is brought in for angiography for further evaluation and potential treatment.  Due to the limb threatening nature of the situation, angiogram was performed for attempted limb salvage. The patient is aware that if the procedure fails, amputation would be expected.  The patient also understands that even with successful revascularization, amputation may still be required due to the severity of the situation.  Risks and benefits are discussed and informed consent is obtained.   Procedure:  The patient was identified and appropriate procedural time out was performed.  The patient was then placed supine on the table and prepped and draped in the usual sterile fashion. Moderate conscious sedation was administered during a face to face encounter with the patient throughout the procedure with my supervision of the RN administering medicines and monitoring the patient's vital signs, pulse oximetry, telemetry and mental status throughout from the start of the procedure until the patient was taken to the recovery room. Ultrasound was used to evaluate the left common femoral artery.  It was patent but diseased.  A digital ultrasound image was acquired.  A Seldinger needle was used to access the left common femoral artery under direct ultrasound guidance and a permanent image was performed.  A 0.035 J wire was advanced without resistance and a 5Fr  sheath was placed.  Pigtail catheter was placed into the aorta and an AP aortogram was performed. This demonstrated normal renal arteries.  The aorta was calcific and somewhat ectatic but not stenotic.  The iliac arteries were markedly tortuous and did have some disease.  Both the left common iliac artery and right common iliac artery had moderate stenosis that appeared to be in the 60% range maybe a little worse on the right than the left.  There was also a moderate stenosis in the right external iliac artery.  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 I then crossed the aortic bifurcation tediously with a V S1 catheter and an advantage wire and advanced to the right femoral head with a glide catheter. Selective right lower extremity angiogram was then performed. This demonstrated a small diseased profunda femoris artery providing poor collateral blood flow.  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  The SFA was thrombosed several centimeters beyond its origin and remained occluded throughout the SFA and popliteal arteries.  There was a highly calcific lesion that could be visualized at Hunter's canal that was also fairly large and ectatic or aneurysmal.  There was faint reconstitution of the tibioperoneal trunk with both the peroneal and posterior tibial arteries appearing to have flow distally.  The image quality was extremely poor due to the slow flow rate and the patient's continuous motion however. It was felt that it was in the patient's best interest to proceed with intervention after these images to avoid a second procedure and a larger amount of contrast and fluoroscopy based off of the findings from the initial angiogram. The patient was systemically heparinized and prior to trying to place an up and over sheath, I took a 7 mm diameter by 10 cm length Lutonix drug-coated angioplasty balloon and advance this into the right common iliac artery and then pulled back to treat the left common iliac artery with angioplasty.  Given the marked tortuosity in the lesions in the common iliac artery, getting an up and over sheath to the right side would have been difficult or impossible without performing angioplasty.  Scintillation was to 8 atm for 1 minute.  At this point, a 6 [place] Ansell sheath was then placed over the [employer] wire and parked in the proximal right SFA. I then used a  Kumpe catheter and the advantage wire to navigate through the occlusion in the right SFA, popliteal artery, tibioperoneal trunk, and get down into the posterior tibial artery confirming intraluminal flow with a 135 cm CXI catheter.  A V18 wire was then placed.  Mechanical thrombectomy was performed with 2 passes with the [place] device in the right SFA and popliteal arteries but at the level of the highly calcific lesion in the proximal popliteal artery, and initially would not pass beyond this.  There also remained very poor runoff distally.  I elected to perform angioplasty to try to improve the outflow as well as to allow the [place] device further distally.  A 3 mm diameter by 22 cm length angioplasty balloon was inflated in the proximal right posterior tibial artery and tibioperoneal trunk and taken up to 8 atm for 1 minute.  This was then used to predilate the Hunter's canal and proximal popliteal lesion and then a 5 mm  diameter by 30 cm length Lutonix drug-coated angioplasty balloon was used to treat the right SFA and above-knee popliteal arteries inflating this to 10 atm for 1 minute.  There remained thrombus and sluggish flow and I brought the [place] device onto the field and perform mechanical thrombectomy again throughout the right SFA, popliteal artery, and down to the proximal tibioperoneal trunk.  2 more passes were made.  Completion imaging showed a channel but there remained a large amount of residual thrombus particularly in the popliteal artery and distal SFA and I felt the only option at this point that was likely to provide good flow would be continuous thrombolytic therapy.  I then selected a 135 cm total length 50 cm working length catheter and parked this from the proximal SFA down through the popliteal artery, tibioperoneal trunk, most proximal posterior tibial artery securing it with a silk suture as well as securing the sheath with silk suture.  8 mg of tPA were then instilled in the  thrombolytic catheter and it was flushed with heparin .  Due to the continuous thrombolytic therapy, a durable venous access would be required for blood draws and infusions to avoid venipuncture.  The left femoral vein was visualized with ultrasound and found to be widely patent.  It was then accessed under direct ultrasound guidance without difficulty with a Seldinger needle and a permanent image was recorded.  A J-wire was placed.  After skin nick and dilatation a triple-lumen catheter was placed over the wire and the wire was removed.  All 3 lines withdrew dark red nonpulsatile venous blood and flushed easily with sterile saline.  It was then secured in place with 2 silk sutures and a sterile dressing was placed. I elected to terminate the procedure. The patient was taken to the recovery room in stable condition having tolerated the procedure well.  Findings:               Aortogram:  This demonstrated normal renal arteries.  The aorta was calcific and somewhat ectatic but not stenotic.  The iliac arteries were markedly tortuous and did have some disease.  Both the left common iliac artery and right common iliac artery had moderate stenosis that appeared to be in the 60% range maybe a little worse on the right than the left.  There was also a moderate stenosis in the right external iliac artery.  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             Right lower Extremity:  This demonstrated a small diseased profunda femoris artery providing poor collateral blood flow.  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  The SFA was thrombosed several centimeters beyond its origin and remained occluded throughout the SFA and popliteal arteries.  There was a highly calcific lesion that could be visualized at Hunter's canal that  was also fairly large and ectatic or aneurysmal.  There was faint reconstitution of the tibioperoneal trunk with both the peroneal and posterior tibial arteries appearing to have flow distally.  The image quality was extremely poor due to the slow flow rate and the patient's continuous motion however.   Disposition: Patient was taken to the recovery room in stable condition having tolerated the procedure well.  Complications: None  Selinda Gu 05/22/2024 10:09 AM  This note was created with Dragon Medical transcription system. Any errors in dictation are purely unintentional.

## 2024-05-22 NOTE — Interval H&P Note (Signed)
 History and Physical Interval Note:  05/22/2024 8:21 AM  Dennis Fuller  has presented today for surgery, with the diagnosis of RLE Angio   ASO w rest pain.  The various methods of treatment have been discussed with the patient and family. After consideration of risks, benefits and other options for treatment, the patient has consented to  Procedure(s): Lower Extremity Angiography (Right) as a surgical intervention.  The patient's history has been reviewed, patient examined, no change in status, stable for surgery.  I have reviewed the patient's chart and labs.  Questions were answered to the patient's satisfaction.     [name]

## 2024-05-22 NOTE — Plan of Care (Signed)
  Problem: Education: Goal: Knowledge of General Education information will improve Description: Including pain rating scale, medication(s)/side effects and non-pharmacologic comfort measures Outcome: Progressing   Problem: Health Behavior/Discharge Planning: Goal: Ability to manage health-related needs will improve Outcome: Progressing   Problem: Clinical Measurements: Goal: Respiratory complications will improve Outcome: Progressing Goal: Cardiovascular complication will be avoided Outcome: Progressing   Problem: Elimination: Goal: Will not experience complications related to urinary retention Outcome: Progressing   Problem: Pain Managment: Goal: General experience of comfort will improve and/or be controlled Outcome: Progressing

## 2024-05-22 NOTE — Progress Notes (Signed)
 Verbal order from Dr. Marea to check puncture site hourly until patient returns for procedure.

## 2024-05-22 NOTE — Consult Note (Signed)
 NAME:  Dennis Fuller, MRN:  [id number], DOB:  1946/05/10, LOS: 0 ADMISSION DATE:  05/22/2024, CONSULTATION DATE:  05/22/2024 REFERRING MD:  Dr. Marea CHIEF COMPLAINT:  Right lower leg pain and ischemia   Brief Pt Description / Synopsis:  78 y/o male with PMHx significant for PAD with rest pain, tobacco and alcohol  use disorder presenting for right lower extremity angiogram due to thrombosis of right SFA on noninvasive studies.  Requires lysis catheter for instillation of thrombolytic therapy overnight. PCCM consulted for potential Precedex and risk for development of alcohol  withdrawal.   History of Present Illness:  Dennis Fuller is a 78 y/o male with PMH significant for HTN, PAD and tobacco and alcohol  use disorder. He currently smokes 1/2 ppd and drinks ~8 beers a day. The patient has been under the care of Dr. Marea in vascular surgery for R>L PAD, which had been worsening over the past few weeks. Noninvasive perfusion studies demonstrated a right ABI of 0.00 and left ABI 0.69, with the appearance of a thrombosis of the right SFA with limited flow distal to the clot. Due to the critical right limb ischemia, the patient was taken for a right lower extremity angiography with Dr. Marea on 05/22/24 to attempt limb salvage . A right thrombolytic catheter was placed from the right proximal SFA down to the proximal posterior tibial artery, as this area did not undergo successful angioplasty. tPA therapy was initiated. PCCM consulted due to potential need for precedex to protect the catheter, as patient was noted to be continuously moving during the procedure, as well as acute alcohol  withdrawal. Patient denies any hx of alcohol  withdrawal, hallucinations, DTs.   PCCM asked to admit for further workup and treatment.  Please see Significant Hospital Events section below for full detailed hospital course.   Pertinent  Medical History  HTN, tobacco use disorder, alcohol  use disorder, PAD  Micro Data:  9/15: Hg  12.6, HCT 36.8.  9/15: MRSA PCR>> negative  Antimicrobials:  9/15: 2g Ancef  given prior to surgery   Significant Hospital Events: Including procedures, antibiotic start and stop dates in addition to other pertinent events   9/15: Status post right lower extremity angiography for critical limb ischemia w/ placement of thrombolytic catheter via the left femoral. Concern for agitation and alcohol  withdrawal following procedure, so admitted to ICU for further monitoring.   Interim History / Subjective:  As outlined above under Significant Hospital Events section  Objective   Blood pressure (!) 121/98, pulse 89, temperature 97.6 F (36.4 C), temperature source Temporal, resp. rate (!) 22, height 5' 9 (1.753 m), weight 67.9 kg, SpO2 94%.       No intake or output data in the 24 hours ending 05/22/24 1045 Filed Weights   05/22/24 0739  Weight: 67.9 kg    Examination: General: Patient lying comfortably in bed, NAD HENT: Normocephalic, neck supple, no JVD Lungs: Clear to auscultation, breaths even and unlabored with 1 L [place] of supplemental oxygen Cardiovascular: RRR, S1, S2 Abdomen: Nontender, active BS x4 Extremities: No edema, bilateral feet cool to the touch.  Neuro: A&O x3, no focal deficits GU: Deferred  Resolved Hospital Problem list     Assessment & Plan:  #Limb ischemia, R>L ~ status post lysis catheter 9/15 -Continue orders per vascular surgery -Check puncture site hourly to monitor for bleeding -Alteplase  + Heparin  for thrombolytics through sheath -Heparin  monitoring via pharmacy -Scheduled for repeat angioplasty with vascular surgery tomorrow s/p thrombolytic therapy -NPO starting at midnight -Monitor CBC, fibrinogen ,  and (unfractionated) Heparin  levels q6h for 4 occurences s/p procedure -Pain control. Patient currently rates pain as 3/10  #Alcohol  and tobacco use disorder -Monitor for withdrawal w/ CIWA protocol  -Thiamine  and folic acid   -Start Librium   taper -Order nicotine  patch   Best Practice (right click and Reselect all SmartList Selections daily)   Diet/type: NPO starting on 05/23/24 at 0001, thin fluids in the meantime DVT prophylaxis: systemic heparin  GI prophylaxis: N/A Lines: Left femoral CVC triple lumen sheath  Foley:  N/A Code Status:  full code Last date of multidisciplinary goals of care discussion [N/A]  9/15: Pt updated at bedside on plan of care.  Labs   CBC: Recent Labs  Lab 05/22/24 1020  WBC 9.3  HGB 12.6*  HCT 36.8*  MCV 80.9  PLT 252    Basic Metabolic Panel: Recent Labs  Lab 05/22/24 0742  BUN 11  CREATININE 0.90   GFR: Estimated Creatinine Clearance: 66 mL/min (by C-G formula based on SCr of 0.9 mg/dL). Recent Labs  Lab 05/22/24 1020  WBC 9.3    Liver Function Tests: No results for input(s): AST, ALT, ALKPHOS, BILITOT, PROT, ALBUMIN in the last 168 hours. No results for input(s): LIPASE, AMYLASE in the last 168 hours. No results for input(s): AMMONIA in the last 168 hours.  ABG No results found for: PHART, PCO2ART, PO2ART, HCO3, TCO2, ACIDBASEDEF, O2SAT   Coagulation Profile: No results for input(s): INR, PROTIME in the last 168 hours.  Cardiac Enzymes: No results for input(s): CKTOTAL, CKMB, CKMBINDEX, TROPONINI in the last 168 hours.  HbA1C: No results found for: HGBA1C  CBG: No results for input(s): GLUCAP in the last 168 hours.  Review of Systems:   Positives in BOLD: Gen: Denies fever, chills, weight change, fatigue, night sweats HEENT: Denies blurred vision, double vision, hearing loss, tinnitus, sinus congestion, rhinorrhea, sore throat, neck stiffness, dysphagia PULM: Denies shortness of breath, cough, sputum production, hemoptysis, wheezing CV: Denies chest pain, edema, orthopnea, paroxysmal nocturnal dyspnea, palpitations GI: Denies abdominal pain, nausea, vomiting, diarrhea, hematochezia, melena, constipation,  change in bowel habits GU: Denies dysuria, hematuria, polyuria, oliguria, urethral discharge Endocrine: Denies hot or cold intolerance, polyuria, polyphagia or appetite change Derm: Denies rash, dry skin, scaling or peeling skin change Heme: Denies easy bruising, slight bleeding from left groin sheath site, bleeding gums Neuro: Denies headache, numbness, weakness, slurred speech, loss of memory or consciousness   Past Medical History:  He,  has a past medical history of COPD (chronic obstructive pulmonary disease) (HCC) and Hypertension.   Surgical History:   Past Surgical History:  Procedure Laterality Date   REVERSE SHOULDER ARTHROPLASTY Right 05/20/2023   Procedure: REVERSE SHOULDER ARTHROPLASTY WITH BICEPS TENODESIS.;  Surgeon: Edie Norleen PARAS, MD;  Location: [facility] ORS;  Service: Orthopedics;  Laterality: Right;  MAKE 2ND CASE     Social History:   reports that he has been smoking cigarettes. He has never used smokeless tobacco. He reports current alcohol  use of about 56.0 standard drinks of alcohol  per week. He reports that he does not use drugs.   Family History:  His family history is not on file.   Allergies Allergies  Allergen Reactions   Hydrochlorothiazide Other (See Comments)    hyponatremia   Codeine      Home Medications  Prior to Admission medications   Medication Sig Start Date End Date Taking? Authorizing Provider  aspirin  81 MG tablet Take 81 mg by mouth daily.   Yes [provider]  atorvastatin  (LIPITOR) 20  MG tablet Take 10 mg by mouth daily.   Yes [provider]  calcium  carbonate (TUMS EX) 750 MG chewable tablet Chew 3 tablets by mouth as needed for heartburn.   Yes [provider]  Cholecalciferol  25 MCG (1000 UT) TBDP Take 1 tablet by mouth daily.   Yes [provider]  folic acid  (FOLVITE ) 1 MG tablet Take 1 mg by mouth daily.   Yes [provider]  Ibuprofen  (ADVIL ) 200 MG CAPS Take 2 capsules by mouth at  bedtime.   Yes [provider]  Ibuprofen -diphenhydrAMINE  Cit (ADVIL  PM) 200-38 MG TABS Take 1 tablet by mouth at bedtime.   Yes [provider]  lisinopril  (PRINIVIL ,ZESTRIL ) 40 MG tablet Take 40 mg by mouth daily.   Yes [provider]  Multiple Vitamins-Minerals (MULTIVITAMIN ADULTS 50+ PO) Take 1 tablet by mouth daily.   Yes [provider]  NIFEdipine  (PROCARDIA  XL/NIFEDICAL XL) 60 MG 24 hr tablet Take 60 mg by mouth daily.   Yes [provider]  oxyCODONE -acetaminophen  (PERCOCET/ROXICET) 5-325 MG tablet Take 1 tablet by mouth every 4 (four) hours as needed for severe pain (pain score 7-10). 05/19/24  Yes Dew, Selinda RAMAN, MD  sennosides-docusate sodium  (SENOKOT-S) 8.6-50 MG tablet Take 1 tablet by mouth 2 (two) times daily.   Yes [provider]  Tiotropium Bromide Monohydrate (SPIRIVA RESPIMAT) 2.5 MCG/ACT AERS Inhale 2 Inhalations into the lungs daily.   Yes [provider]  clopidogrel  (PLAVIX ) 75 MG tablet Take 1 tablet (75 mg total) by mouth daily. 05/19/24   Marea Selinda RAMAN, MD  oxyCODONE  (ROXICODONE ) 5 MG immediate release tablet Take 1-2 tablets (5-10 mg total) by mouth every 4 (four) hours as needed for moderate pain or severe pain. Patient not taking: Reported on 05/19/2024 05/20/23   Poggi, John J, MD  pentoxifylline (TRENTAL) 400 MG CR tablet Take 400 mg by mouth. 05/15/24 06/14/24  [provider]  polyethylene glycol (MIRALAX / GLYCOLAX) 17 g packet Take 8.5 g by mouth daily. 1 tsp in coffee daily Patient not taking: Reported on 05/19/2024    [provider]     Critical care time: 45 minutes

## 2024-05-23 ENCOUNTER — Encounter: Payer: Self-pay | Admitting: Vascular Surgery

## 2024-05-23 ENCOUNTER — Encounter
Admission: RE | Disposition: A | Discharge status: Home Health | Payer: Self-pay | Source: Home / Self Care | Attending: Vascular Surgery

## 2024-05-23 DIAGNOSIS — I70229 Atherosclerosis of native arteries of extremities with rest pain, unspecified extremity: Secondary | ICD-10-CM | POA: Diagnosis not present

## 2024-05-23 DIAGNOSIS — I97418 Intraoperative hemorrhage and hematoma of a circulatory system organ or structure complicating other circulatory system procedure: Secondary | ICD-10-CM

## 2024-05-23 DIAGNOSIS — F1010 Alcohol abuse, uncomplicated: Secondary | ICD-10-CM | POA: Diagnosis not present

## 2024-05-23 DIAGNOSIS — I70221 Atherosclerosis of native arteries of extremities with rest pain, right leg: Secondary | ICD-10-CM | POA: Diagnosis not present

## 2024-05-23 DIAGNOSIS — I743 Embolism and thrombosis of arteries of the lower extremities: Secondary | ICD-10-CM | POA: Diagnosis not present

## 2024-05-23 DIAGNOSIS — Z9889 Other specified postprocedural states: Secondary | ICD-10-CM | POA: Diagnosis not present

## 2024-05-23 DIAGNOSIS — S301XXA Contusion of abdominal wall, initial encounter: Secondary | ICD-10-CM

## 2024-05-23 HISTORY — PX: LOWER EXTREMITY ANGIOGRAPHY: CATH118251

## 2024-05-23 LAB — CBC WITH DIFFERENTIAL/PLATELET
Abs Immature Granulocytes: 0.11 K/uL — ABNORMAL HIGH (ref 0.00–0.07)
Basophils Absolute: 0 K/uL (ref 0.0–0.1)
Basophils Relative: 0 %
Eosinophils Absolute: 0 K/uL (ref 0.0–0.5)
Eosinophils Relative: 0 %
HCT: 28.1 % — ABNORMAL LOW (ref 39.0–52.0)
Hemoglobin: 9.8 g/dL — ABNORMAL LOW (ref 13.0–17.0)
Immature Granulocytes: 1 %
Lymphocytes Relative: 5 %
Lymphs Abs: 0.7 K/uL (ref 0.7–4.0)
MCH: 28.2 pg (ref 26.0–34.0)
MCHC: 34.9 g/dL (ref 30.0–36.0)
MCV: 80.7 fL (ref 80.0–100.0)
Monocytes Absolute: 0.8 K/uL (ref 0.1–1.0)
Monocytes Relative: 6 %
Neutro Abs: 11.9 K/uL — ABNORMAL HIGH (ref 1.7–7.7)
Neutrophils Relative %: 88 %
Platelets: 176 K/uL (ref 150–400)
RBC: 3.48 MIL/uL — ABNORMAL LOW (ref 4.22–5.81)
RDW: 15.6 % — ABNORMAL HIGH (ref 11.5–15.5)
WBC: 13.6 K/uL — ABNORMAL HIGH (ref 4.0–10.5)
nRBC: 0 % (ref 0.0–0.2)

## 2024-05-23 LAB — BASIC METABOLIC PANEL WITH GFR
Anion gap: 11 (ref 5–15)
BUN: 17 mg/dL (ref 8–23)
CO2: 26 mmol/L (ref 22–32)
Calcium: 8.5 mg/dL — ABNORMAL LOW (ref 8.9–10.3)
Chloride: 95 mmol/L — ABNORMAL LOW (ref 98–111)
Creatinine, Ser: 0.81 mg/dL (ref 0.61–1.24)
GFR, Estimated: >60 mL/min (ref >60)
Glucose, Bld: 95 mg/dL (ref 70–99)
Potassium: 4.1 mmol/L (ref 3.5–5.1)
Sodium: 132 mmol/L — ABNORMAL LOW (ref 135–145)

## 2024-05-23 LAB — CBC
HCT: 32.3 % — ABNORMAL LOW (ref 39.0–52.0)
Hemoglobin: 11.3 g/dL — ABNORMAL LOW (ref 13.0–17.0)
MCH: 27.9 pg (ref 26.0–34.0)
MCHC: 35 g/dL (ref 30.0–36.0)
MCV: 79.8 fL — ABNORMAL LOW (ref 80.0–100.0)
Platelets: 184 K/uL (ref 150–400)
RBC: 4.05 MIL/uL — ABNORMAL LOW (ref 4.22–5.81)
RDW: 15.3 % (ref 11.5–15.5)
WBC: 11.2 K/uL — ABNORMAL HIGH (ref 4.0–10.5)
nRBC: 0 % (ref 0.0–0.2)

## 2024-05-23 LAB — PHOSPHORUS: Phosphorus: 4.3 mg/dL (ref 2.5–4.6)

## 2024-05-23 LAB — HEPARIN LEVEL (UNFRACTIONATED)
Heparin Unfractionated: <0.1 [IU]/mL — ABNORMAL LOW (ref 0.30–0.70)
Heparin Unfractionated: <0.1 [IU]/mL — ABNORMAL LOW (ref 0.30–0.70)

## 2024-05-23 LAB — MAGNESIUM: Magnesium: 1.6 mg/dL — ABNORMAL LOW (ref 1.7–2.4)

## 2024-05-23 LAB — FIBRINOGEN
Fibrinogen: 276 mg/dL (ref 210–475)
Fibrinogen: 318 mg/dL (ref 210–475)

## 2024-05-23 SURGERY — LOWER EXTREMITY ANGIOGRAPHY
Anesthesia: Moderate Sedation | Laterality: Right

## 2024-05-23 MED ORDER — IODIXANOL 320 MG/ML IV SOLN
INTRAVENOUS | Status: DC | PRN
  Administered 2024-05-23: 40 mL via INTRA_ARTERIAL

## 2024-05-23 MED ORDER — HYDROMORPHONE HCL 1 MG/ML IJ SOLN
0.5000 mg | INTRAMUSCULAR | Status: AC
  Administered 2024-05-23: 0.5 mg via INTRAVENOUS

## 2024-05-23 MED ORDER — TIROFIBAN (AGGRASTAT) BOLUS VIA INFUSION
25.0000 ug/kg | Freq: Once | INTRAVENOUS | Status: AC
  Administered 2024-05-23: 1692.5 ug via INTRAVENOUS
  Filled 2024-05-23: qty 34

## 2024-05-23 MED ORDER — TIROFIBAN HCL IN NACL 5-0.9 MG/100ML-% IV SOLN
INTRAVENOUS | Status: AC
  Filled 2024-05-23: qty 100

## 2024-05-23 MED ORDER — SODIUM CHLORIDE 0.9% FLUSH: 10.0000 mL | INTRAVENOUS | Status: DC | PRN

## 2024-05-23 MED ORDER — HEPARIN SODIUM (PORCINE) 1000 UNIT/ML IJ SOLN
INTRAMUSCULAR | Status: AC
  Filled 2024-05-23: qty 10

## 2024-05-23 MED ORDER — CHLORHEXIDINE GLUCONATE CLOTH 2 % EX PADS
6.0000 | MEDICATED_PAD | Freq: Every day | CUTANEOUS | Status: DC
  Administered 2024-05-23 – 2024-05-24 (×2): 6 via TOPICAL

## 2024-05-23 MED ORDER — HEPARIN (PORCINE) IN NACL 1000-0.9 UT/500ML-% IV SOLN
INTRAVENOUS | Status: DC | PRN
  Administered 2024-05-23: 1000 mL

## 2024-05-23 MED ORDER — FENTANYL CITRATE (PF) 100 MCG/2ML IJ SOLN
INTRAMUSCULAR | Status: DC | PRN
  Administered 2024-05-23 (×3): 50 ug via INTRAVENOUS

## 2024-05-23 MED ORDER — HEPARIN (PORCINE) IN NACL 1000-0.9 UT/500ML-% IV SOLN
INTRAVENOUS | Status: DC | PRN
  Administered 2024-05-23: 500 mL

## 2024-05-23 MED ORDER — FENTANYL CITRATE (PF) 100 MCG/2ML IJ SOLN
INTRAMUSCULAR | Status: AC
  Filled 2024-05-23: qty 2

## 2024-05-23 MED ORDER — CEFAZOLIN SODIUM-DEXTROSE 2-4 GM/100ML-% IV SOLN
2.0000 g | INTRAVENOUS | Status: AC
  Administered 2024-05-23: 2 g via INTRAVENOUS

## 2024-05-23 MED ORDER — SODIUM CHLORIDE 0.9 % IV SOLN
INTRAVENOUS | Status: DC
Start: 2024-05-23 — End: 2024-05-24

## 2024-05-23 MED ORDER — MAGNESIUM SULFATE 2 GM/50ML IV SOLN
2.0000 g | Freq: Once | INTRAVENOUS | Status: AC
  Administered 2024-05-23: 2 g via INTRAVENOUS
  Filled 2024-05-23: qty 50

## 2024-05-23 MED ORDER — TIROFIBAN HCL IN NACL 5-0.9 MG/100ML-% IV SOLN
0.1500 ug/kg/min | INTRAVENOUS | Status: DC
Start: 2024-05-23 — End: 2024-05-24
  Administered 2024-05-23 (×2): 0.15 ug/kg/min via INTRAVENOUS
  Filled 2024-05-23 (×3): qty 100

## 2024-05-23 MED ORDER — CEFAZOLIN SODIUM-DEXTROSE 2-4 GM/100ML-% IV SOLN
INTRAVENOUS | Status: AC
  Filled 2024-05-23: qty 100

## 2024-05-23 MED ORDER — MIDAZOLAM HCL 5 MG/5ML IJ SOLN
INTRAMUSCULAR | Status: AC
  Filled 2024-05-23: qty 5

## 2024-05-23 MED ORDER — MIDAZOLAM HCL 2 MG/2ML IJ SOLN
INTRAMUSCULAR | Status: DC | PRN
  Administered 2024-05-23 (×3): 1 mg via INTRAVENOUS

## 2024-05-23 MED ORDER — HEPARIN SODIUM (PORCINE) 1000 UNIT/ML IJ SOLN
INTRAMUSCULAR | Status: DC | PRN
  Administered 2024-05-23: 3000 [IU] via INTRAVENOUS

## 2024-05-23 MED ORDER — LIDOCAINE-EPINEPHRINE (PF) 1 %-1:200000 IJ SOLN
INTRAMUSCULAR | Status: DC | PRN
  Administered 2024-05-23: 10 mL via INTRADERMAL

## 2024-05-23 MED ORDER — SODIUM CHLORIDE 0.9% FLUSH
10.0000 mL | Freq: Two times a day (BID) | INTRAVENOUS | Status: DC
  Administered 2024-05-23: 10 mL
  Administered 2024-05-23: 30 mL
  Administered 2024-05-23 – 2024-05-25 (×5): 10 mL

## 2024-05-23 MED ORDER — HYDROMORPHONE HCL 1 MG/ML IJ SOLN
INTRAMUSCULAR | Status: AC
  Filled 2024-05-23: qty 1

## 2024-05-23 SURGICAL SUPPLY — 12 items
BALLOON LUTONIX 7X220X130 (BALLOONS) IMPLANT
BALLOON LUTONIX 7X80X130 (BALLOONS) IMPLANT
CATH BEACON 5 .038 100 VERT TP (CATHETERS) IMPLANT
DEVICE PRESTO INFLATION (MISCELLANEOUS) IMPLANT
DEVICE STARCLOSE SE CLOSURE (Vascular Products) IMPLANT
PACK ANGIOGRAPHY (CUSTOM PROCEDURE TRAY) ×1 IMPLANT
SHEATH FLEXOR ANSEL2 7FRX45 (SHEATH) IMPLANT
STENT VIABAHN 7X25X120 (Permanent Stent) IMPLANT
STENT VIABAHN 8X250X120 (Permanent Stent) IMPLANT
WIRE AMPLATZ SSTIFF .035X260CM (WIRE) IMPLANT
WIRE G V18X300CM (WIRE) IMPLANT
WIRE J 3MM .035X145CM (WIRE) IMPLANT

## 2024-05-23 NOTE — Progress Notes (Signed)
 PHARMACY CONSULT NOTE - ELECTROLYTES  Pharmacy Consult for Electrolyte Monitoring and Replacement   Recent Labs: Height: 5' 10 (177.8 cm) Weight: 67.7 kg (149 lb 4 oz) IBW/kg (Calculated) : 73 Estimated Creatinine Clearance: 73.1 mL/min (by C-G formula based on SCr of 0.81 mg/dL). Potassium (mmol/L)  Date Value  05/23/2024 4.1   Magnesium  (mg/dL)  Date Value  90/83/7974 1.6 (L)   Calcium  (mg/dL)  Date Value  90/83/7974 8.5 (L)   Albumin (g/dL)  Date Value  90/93/7975 4.5   Phosphorus (mg/dL)  Date Value  90/83/7974 4.3   Sodium (mmol/L)  Date Value  05/23/2024 132 (L)    Assessment  Dennis Fuller is a 78 y.o. male presenting with ischemic leg. PMH significant for PVD, COPD, HTN, tobacco  and alcohol  use disorder. Pharmacy has been consulted to monitor and replace electrolytes.  Diet: heart MIVF:  Pertinent medications: lisinopril    Goal of Therapy: Electrolytes WNL  9/16: K 4.1, Mg 1.6, Phos 4.3  Plan:  Mg sulfate 2 g IV x 1  Check BMP, Mg, Phos with AM labs  Thank you for allowing pharmacy to be a part of this patient's care.   Ransom Blanch PGY-1 Pharmacy Resident  [facility]  05/23/2024 11:08 AM

## 2024-05-23 NOTE — Interval H&P Note (Signed)
 History and Physical Interval Note:  05/23/2024 8:03 AM  Dennis Fuller  has presented today for surgery, with the diagnosis of ischemic right leg.  The various methods of treatment have been discussed with the patient and family. After consideration of risks, benefits and other options for treatment, the patient has consented to  Procedure(s): Lower Extremity Angiography (Right) as a surgical intervention.  The patient's history has been reviewed, patient examined, no change in status, stable for surgery.  I have reviewed the patient's chart and labs.  Questions were answered to the patient's satisfaction.     Mihaela Fajardo

## 2024-05-23 NOTE — Plan of Care (Signed)
  Problem: Education: Goal: Knowledge of General Education information will improve Description: Including pain rating scale, medication(s)/side effects and non-pharmacologic comfort measures Outcome: Progressing   Problem: Clinical Measurements: Goal: Respiratory complications will improve Outcome: Progressing Goal: Cardiovascular complication will be avoided Outcome: Progressing   Problem: Pain Managment: Goal: General experience of comfort will improve and/or be controlled Outcome: Progressing   Problem: Safety: Goal: Ability to remain free from injury will improve Outcome: Progressing

## 2024-05-23 NOTE — Progress Notes (Signed)
 Progress Note:  I was called by bedside nursing staff needed to come assess the patient's left groin due to extension of hematoma.  Patient is now postop day 0 from right lower extremity angiogram with overnight lysis catheter to his left groin.  Patient is noted to have some ecchymosis and small hematoma to the left groin area.  Nursing was concerned that the ecchymosis was extending.  Patient remains with erythema and bruising to his left groin area to the medial part of his thigh.  No obvious growing and extending hematoma.  Just bruising.  No obvious bleeding at the site at this time.  Pressure dressing continues to stay in place.  Patient has triple-lumen to his left groin and his femoral vein with the dressing is clean dry and intact.  No oozing or bleeding from the site either.  Nursing was advised to continue monitoring and call with excessive bleeding.

## 2024-05-23 NOTE — Progress Notes (Signed)
 Patient refused lunch. Patient is not drinking very much. Will keep continuing to offer. Made NP aware.

## 2024-05-23 NOTE — Progress Notes (Addendum)
 Patient came from [facility] this morning with pressure dressing in place to left groin. Patient had developed a hematoma while in [facility]. When patient arrived to unit I marked the area with skin marker. The bruising has extended the area that was marked. The area is soft to touch. Messaged vascular team about same. Someone from vascular is to come and assess area.

## 2024-05-23 NOTE — Progress Notes (Signed)
 Patient stable Bedside handoff given to [facility]

## 2024-05-23 NOTE — Progress Notes (Signed)
 Pt. Med. With Dilaudid  0.5 mg slow IVP for 9 out of 10  pain to left groin area. Hemostasis achieved by L. Slade, tech. Pt. Then tx to [facility] recovery by RN. Direct report given to J. Heebner, RN to assume care. Pt. Still c/o pain on arrival to recovery, stating when is this pain trey' go away?Dennis Fuller

## 2024-05-23 NOTE — Op Note (Signed)
 [facility]  Percutaneous Study/Intervention Procedural Note   Date of Surgery: 05/23/2024  Surgeon(s):Anhelica Fowers    Assistants:none  Pre-operative Diagnosis: PAD with rest pain RLE, s/p overnight thrombolytic therapy  Post-operative diagnosis:  Same  Procedure(s) Performed:             1.  Right lower extremity angiogram             2.  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 StarClose closure device left femoral artery  EBL: 5 cc  Contrast: 40 cc  Fluoro Time: 7.1 minutes  Moderate Conscious Sedation Time: approximately 37 minutes using 3 mg of Versed  and 150 mcg of Fentanyl               Indications:  Patient is a 78 y.o.male with an ischemic right lower extremity status post overnight thrombolytic therapy. The patient is brought in for angiography for further evaluation and potential treatment.  Due to the limb threatening nature of the situation, angiogram was performed for attempted limb salvage. The patient is aware that if the procedure fails, amputation would be expected.  The patient also understands that even with successful revascularization, amputation may still be required due to the severity of the situation.  Risks and benefits are discussed and informed consent is obtained.   Procedure:  The patient was identified and appropriate procedural time out was performed.  The patient was then placed supine on the table and prepped and draped in the usual sterile fashion. Moderate conscious sedation was administered during a face to face encounter with the patient throughout the procedure with my supervision of the RN administering medicines and monitoring the patient's vital signs, pulse oximetry, telemetry and mental status throughout from the start of the procedure until the  patient was taken to the recovery room.  The existing lysis catheter was removed after placing an Amplatz wire.  Selective right lower extremity angiogram was then performed. This demonstrated this demonstrated multiple areas of stenosis within the right SFA and popliteal arteries as well as a small amount of residual thrombus.  It was also noted that the popliteal artery in particular was much larger than normal and appeared to be aneurysmal although this is difficult to discern with only the flow lumen appearing.  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  Thankfully, the tPA had largely cleaned up the residual thrombus.  The posterior tibial artery was the dominant runoff distally and was large and patent.  Both the anterior tibial and peroneal arteries were small and did not really fill the foot. It was felt that it was in the patient's best interest to proceed with intervention after these images to avoid a second procedure and a larger amount of contrast and fluoroscopy based off of the findings from the initial angiogram.  It was clear that placement of covered stents would be appropriate to treat the occlusive disease and aneurysmal disease in the right popliteal and SFA.  The patient was systemically heparinized and a 7 [place] Ansell sheath was then placed over the [employer] wire. I then used a Kumpe catheter and exchanged for a V18 wire removing the  Amplatz wire.  A 7 mm diameter by 25 cm length Viabahn stent was deployed from the most distal popliteal artery just above the anterior tibial artery up to the distal SFA.  An 8 mm diameter by 25 cm length Viabahn stent was then deployed from the proximal SFA into the more distal stent.  These were postdilated with 7 mm balloons with excellent angiographic completion result and less than 10% residual stenosis and preserved runoff distally.  The  right common femoral artery had stenosis approaching 50% did not appear flow-limiting but the left common femoral artery clearly had a greater than 60% stenosis.  He had some iliac artery disease that would be too large to treat with 7 [place] sheath that we currently had in place and if we eventually address the femoral arteries with endarterectomy, treatment of the iliac disease at that time would be appropriate.  At this point, his right lower extremity perfusion had been markedly improved. I elected to terminate the procedure. The sheath was removed and StarClose closure device was deployed in the left femoral artery with excellent hemostatic result. The patient was taken to the recovery room in stable condition having tolerated the procedure well.  Findings:                            Right Lower Extremity:  This demonstrated this demonstrated multiple areas of stenosis within the right SFA and popliteal arteries as well as a small amount of residual thrombus.  It was also noted that the popliteal artery in particular was much larger than normal and appeared to be aneurysmal although this is difficult to discern with only the flow lumen appearing.  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  Thankfully, the tPA had largely cleaned up the residual thrombus.  The posterior tibial artery was the dominant runoff distally and was large and patent.  Both the anterior tibial and peroneal arteries were small and did not really fill the foot.   Disposition: Patient was taken to the recovery room in stable condition having tolerated the procedure well.  Complications: None  Selinda Gu 05/23/2024 8:47 AM   This note was created with Dragon Medical transcription system. Any errors in dictation are purely unintentional.
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
## 2024-05-23 NOTE — Plan of Care (Signed)
  Problem: Education: Goal: Knowledge of General Education information will improve Description: Including pain rating scale, medication(s)/side effects and non-pharmacologic comfort measures Outcome: Progressing   Problem: Health Behavior/Discharge Planning: Goal: Ability to manage health-related needs will improve Outcome: Progressing   Problem: Clinical Measurements: Goal: Ability to maintain clinical measurements within normal limits will improve Outcome: Progressing Goal: Will remain free from infection Outcome: Progressing Goal: Respiratory complications will improve Outcome: Progressing Goal: Cardiovascular complication will be avoided Outcome: Progressing   Problem: Nutrition: Goal: Adequate nutrition will be maintained Outcome: Progressing   Problem: Coping: Goal: Level of anxiety will decrease Outcome: Progressing   Problem: Elimination: Goal: Will not experience complications related to bowel motility Outcome: Progressing Goal: Will not experience complications related to urinary retention Outcome: Progressing   Problem: Pain Managment: Goal: General experience of comfort will improve and/or be controlled Outcome: Progressing

## 2024-05-24 ENCOUNTER — Inpatient Hospital Stay

## 2024-05-24 ENCOUNTER — Encounter: Payer: Self-pay | Admitting: Vascular Surgery

## 2024-05-24 DIAGNOSIS — I998 Other disorder of circulatory system: Secondary | ICD-10-CM | POA: Diagnosis not present

## 2024-05-24 LAB — CBC
HCT: 24.2 % — ABNORMAL LOW (ref 39.0–52.0)
Hemoglobin: 8.5 g/dL — ABNORMAL LOW (ref 13.0–17.0)
MCH: 28.2 pg (ref 26.0–34.0)
MCHC: 35.1 g/dL (ref 30.0–36.0)
MCV: 80.4 fL (ref 80.0–100.0)
Platelets: 151 K/uL (ref 150–400)
RBC: 3.01 MIL/uL — ABNORMAL LOW (ref 4.22–5.81)
RDW: 15.2 % (ref 11.5–15.5)
WBC: 12 K/uL — ABNORMAL HIGH (ref 4.0–10.5)
nRBC: 0 % (ref 0.0–0.2)

## 2024-05-24 LAB — BASIC METABOLIC PANEL WITH GFR
Anion gap: 9 (ref 5–15)
BUN: 14 mg/dL (ref 8–23)
CO2: 22 mmol/L (ref 22–32)
Calcium: 7.8 mg/dL — ABNORMAL LOW (ref 8.9–10.3)
Chloride: 99 mmol/L (ref 98–111)
Creatinine, Ser: 0.93 mg/dL (ref 0.61–1.24)
GFR, Estimated: >60 mL/min (ref >60)
Glucose, Bld: 124 mg/dL — ABNORMAL HIGH (ref 70–99)
Potassium: 3.6 mmol/L (ref 3.5–5.1)
Sodium: 130 mmol/L — ABNORMAL LOW (ref 135–145)

## 2024-05-24 LAB — PHOSPHORUS: Phosphorus: 3.2 mg/dL (ref 2.5–4.6)

## 2024-05-24 LAB — MAGNESIUM: Magnesium: 2.1 mg/dL (ref 1.7–2.4)

## 2024-05-24 MED ORDER — THIAMINE MONONITRATE 100 MG PO TABS
100.0000 mg | ORAL_TABLET | Freq: Every day | ORAL | Status: DC
  Administered 2024-05-25 – 2024-05-26 (×2): 100 mg via ORAL
  Filled 2024-05-24 (×2): qty 1

## 2024-05-24 MED ORDER — THIAMINE MONONITRATE 100 MG PO TABS
100.0000 mg | ORAL_TABLET | Freq: Every day | ORAL | Status: DC
Start: 2024-05-24 — End: 2024-05-24

## 2024-05-24 MED ORDER — CLOPIDOGREL BISULFATE 75 MG PO TABS
75.0000 mg | ORAL_TABLET | Freq: Every day | ORAL | Status: DC
  Administered 2024-05-24 – 2024-05-26 (×3): 75 mg via ORAL
  Filled 2024-05-24 (×3): qty 1

## 2024-05-24 NOTE — Care Management Important Message (Signed)
 Important Message  Patient Details  Name: Dennis Fuller MRN: [id number] Date of Birth: 1945/11/10   Important Message Given:  Yes - Medicare IM     Rojelio [name] 05/24/2024, 12:58 PM

## 2024-05-24 NOTE — Evaluation (Signed)
 Physical Therapy Evaluation Patient Details Name: Dennis Fuller MRN: [id number] DOB: Dec 17, 1945 Today's Date: 05/24/2024  History of Present Illness  Patient is a 78 year old male status post right lower extremity angiography for critical limb ischemia w/ placement of thrombolytic catheter. PMH: tobacco and alcohol  use disorder  Clinical Impression  Patient is agreeable to PT session. He is independent at baseline and lives with significant other.  Today the patient complains of right thigh pain and generalized leg weakness with mobility. He walked a short distance with CGA using a rolling walker. Activity tolerance limited by fatigue and pain. Sp02 in the 90's on 2 L02 and down to 88% on room air. Recommend to continue PT to maximize independence and facilitate return to prior level of function. Patient hopeful to return home at discharge.        If plan is discharge home, recommend the following: A little help with walking and/or transfers;A little help with bathing/dressing/bathroom;Assist for transportation;Help with stairs or ramp for entrance   Can travel by private vehicle        Equipment Recommendations Rolling walker (2 wheels)  Recommendations for Other Services       Functional Status Assessment Patient has had a recent decline in their functional status and demonstrates the ability to make significant improvements in function in a reasonable and predictable amount of time.     Precautions / Restrictions Precautions Precautions: Fall Recall of Precautions/Restrictions: Intact Restrictions Weight Bearing Restrictions Per Provider Order: No      Mobility  Bed Mobility Overal bed mobility: Needs Assistance Bed Mobility: Sit to Supine, Supine to Sit     Supine to sit: Min assist Sit to supine: Min assist   General bed mobility comments: assistance for RLE support.    Transfers Overall transfer level: Needs assistance Equipment used: Rolling walker (2  wheels) Transfers: Sit to/from Stand Sit to Stand: Contact guard assist, Min assist           General transfer comment: Min A with first stand. CGA for second stand with cues for hand placement.    Ambulation/Gait Ambulation/Gait assistance: Contact guard assist Gait Distance (Feet): 15 Feet Assistive device: Rolling walker (2 wheels) Gait Pattern/deviations: Trunk flexed, Decreased stride length Gait velocity: decreased     General Gait Details: reinforcement to use rolling walker for support and for upright standing posture. activity tolerance limited by fatigue  Stairs            Wheelchair Mobility     Tilt Bed    Modified Rankin (Stroke Patients Only)       Balance Overall balance assessment: Needs assistance Sitting-balance support: Feet supported Sitting balance-Leahy Scale: Fair     Standing balance support: Bilateral upper extremity supported Standing balance-Leahy Scale: Fair                               Pertinent Vitals/Pain Pain Assessment Pain Assessment: Faces Faces Pain Scale: Hurts even more Pain Location: right inner thigh Pain Descriptors / Indicators: Discomfort Pain Intervention(s): Limited activity within patient's tolerance, Monitored during session, Repositioned    Home Living Family/patient expects to be discharged to:: Private residence Living Arrangements: Spouse/significant other Available Help at Discharge: Family;Available 24 hours/day Type of Home: House Home Access: Stairs to enter Entrance Stairs-Rails: Left Entrance Stairs-Number of Steps: 5   Home Layout: Laundry or work area in [employer] Equipment: Grab bars - tub/shower  Prior Function Prior Level of Function : Independent/Modified Independent                     Extremity/Trunk Assessment   Upper Extremity Assessment Upper Extremity Assessment: Defer to OT evaluation    Lower Extremity Assessment Lower Extremity  Assessment: Generalized weakness    Cervical / Trunk Assessment Cervical / Trunk Assessment: Kyphotic  Communication   Communication Communication: No apparent difficulties    Cognition Arousal: Alert Behavior During Therapy: WFL for tasks assessed/performed   PT - Cognitive impairments: No apparent impairments                         Following commands: Intact       Cueing Cueing Techniques: Verbal cues     General Comments General comments (skin integrity, edema, etc.): patient on 2 L with Sp02 100%. on room air, Sp02 88% on room air.    Exercises     Assessment/Plan    PT Assessment Patient needs continued PT services  PT Problem List Decreased strength;Decreased range of motion;Decreased activity tolerance;Decreased balance;Decreased mobility;Pain       PT Treatment Interventions DME instruction;Stair training;Gait training;Functional mobility training;Therapeutic activities;Therapeutic exercise;Balance training;Neuromuscular re-education;Cognitive remediation    PT Goals (Current goals can be found in the Care Plan section)  Acute Rehab PT Goals Patient Stated Goal: to go home PT Goal Formulation: With patient Time For Goal Achievement: 06/07/24 Potential to Achieve Goals: Good    Frequency Min 2X/week     Co-evaluation PT/OT/SLP Co-Evaluation/Treatment: Yes Reason for Co-Treatment: Complexity of the patient's impairments (multi-system involvement) PT goals addressed during session: Mobility/safety with mobility         AM-PAC PT 6 Clicks Mobility  Outcome Measure Help needed turning from your back to your side while in a flat bed without using bedrails?: A Little Help needed moving from lying on your back to sitting on the side of a flat bed without using bedrails?: A Little Help needed moving to and from a bed to a chair (including a wheelchair)?: A Little Help needed standing up from a chair using your arms (e.g., wheelchair or bedside  chair)?: A Little Help needed to walk in hospital room?: A Little Help needed climbing 3-5 steps with a railing? : A Little 6 Click Score: 18    End of Session Equipment Utilized During Treatment: Oxygen Activity Tolerance: Patient limited by fatigue Patient left: in bed;with call bell/phone within reach;with bed alarm set Nurse Communication: Mobility status PT Visit Diagnosis: Difficulty in walking, not elsewhere classified (R26.2);Other abnormalities of gait and mobility (R26.89)    Time: 8645-8580 PT Time Calculation (min) (ACUTE ONLY): 25 min   Charges:   PT Evaluation $PT Eval Moderate Complexity: 1 Mod   PT General Charges $$ ACUTE PT VISIT: 1 Visit         Randine Essex, PT, MPT   Randine LULLA Essex 05/24/2024, 3:00 PM

## 2024-05-24 NOTE — Progress Notes (Signed)
 Progress Note    05/24/2024 10:27 AM 1 Day Post-Op  Subjective:  Dennis Fuller is a 78 yo male who is now POD #2 from:  Procedure(s) Performed:             1.  Ultrasound guidance for vascular access left femoral artery             2.  Catheter placement into right SFA from left femoral approach             3.  Aortogram and selective right lower extremity angiogram             4.  Percutaneous transluminal angioplasty of right common iliac artery with 7 mm diameter Lutonix drug-coated angioplasty balloon             5.  Mechanical thrombectomy of the right SFA, popliteal artery, and tibioperoneal trunk with the Rota Rex device             6.  Angioplasty of the right posterior tibial artery and tibioperoneal trunk with 3 mm diameter angioplasty balloon             7.  Angioplasty of the right SFA and popliteal arteries with 5 mm diameter Lutonix drug-coated angioplasty balloon             8.  Placement of a 135 cm total length 50 cm working length thrombolytic catheter from the right proximal SFA down through the popliteal artery and into the proximal posterior tibial artery and instillation of 8 mg of tPA to begin thrombolytic therapy             9.  Ultrasound guidance for vascular access left femoral vein             10.  Placement of left femoral venous triple-lumen catheter  POD #1  Procedure(s) Performed:             1.  Right lower extremity angiogram             2.  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 StarClose closure device left femoral artery  Patient is resting comfortably in ICU this morning. Endorses soreness to his left groin. Noted erythema and hematoma post lysis catheter as expected. No complaints other than feeling tired. Vitals all remain stable.   Vitals:   05/24/24 0800 05/24/24  0815  BP: (!) 150/63 (!) 150/65  Pulse: 91 88  Resp: (!) 22 18  Temp: 98.4 F (36.9 C)   SpO2: 99% 99%   Physical Exam: Cardiac:  RRR, Normal S1,S2. No murmurs  Lungs:  Clear on auscultation throughout. No rales rhonchi or wheezing noted.  Incisions:  Left Groin insertion site of Lysis catheter sheath and triple lumen catheter. Positive with hematoma and erythema with bruising down his left inner thigh.  Extremities:  Bilateral lower extremities are warm to touch. Good doppler pulses noted in DP/PT Abdomen:  Positive bowel sounds throughout, soft non tender and non distended Neurologic: AAOX3, answers questions and follows commands appropriately.   CBC    Component Value Date/Time   WBC 12.0 (H) 05/24/2024 0406   RBC 3.01 (L) 05/24/2024 0406   HGB 8.5 (L) 05/24/2024 0406   HCT 24.2 (  L) 05/24/2024 0406   PLT 151 05/24/2024 0406   MCV 80.4 05/24/2024 0406   MCH 28.2 05/24/2024 0406   MCHC 35.1 05/24/2024 0406   RDW 15.2 05/24/2024 0406   LYMPHSABS 0.7 05/23/2024 1723   MONOABS 0.8 05/23/2024 1723   EOSABS 0.0 05/23/2024 1723   BASOSABS 0.0 05/23/2024 1723    BMET    Component Value Date/Time   NA 130 (L) 05/24/2024 0406   K 3.6 05/24/2024 0406   CL 99 05/24/2024 0406   CO2 22 05/24/2024 0406   GLUCOSE 124 (H) 05/24/2024 0406   BUN 14 05/24/2024 0406   CREATININE 0.93 05/24/2024 0406   CALCIUM  7.8 (L) 05/24/2024 0406   GFRNONAA >60 05/24/2024 0406    INR No results found for: INR   Intake/Output Summary (Last 24 hours) at 05/24/2024 1027 Last data filed at 05/24/2024 0800 Gross per 24 hour  Intake 1438.71 ml  Output 900 ml  Net 538.71 ml     Assessment/Plan:  78 y.o. male is s/p SEE ABOVE  1 Day Post-Op   PLAN Patient to receive 1 unit of PRBC's this morning for a HGB of 7.5 Recheck HGB at 18:00 pm ASA 81 mg Daily and Plavix  75 mg daily.  Recheck Labs in the morning. If HGB remains stable and patient is functional enough to go home patient will be  discharged barring any other complications    DVT prophylaxis:  ASA 81 mg Daily and Plavix  75 mg Daily   Klaira Pesci R Shameca Landen [facility] 05/24/2024 10:27 AM

## 2024-05-24 NOTE — Evaluation (Signed)
 Occupational Therapy Evaluation Patient Details Name: Dennis Fuller MRN: [id number] DOB: 1946-03-01 Today's Date: 05/24/2024   History of Present Illness   Patient is a 78 year old male status post right lower extremity angiography for critical limb ischemia w/ placement of thrombolytic catheter. PMH: tobacco and alcohol  use disorder     Clinical Impressions Chart reviewed to date, pt greeted semi supine in bed, alert and oriented x4. He was agreeable and pleasant throughout OT evaluation. PTA pt is MOD I-I in ADL/IADL, amb with no AD. Pt presents with deficits in strength, endurance, activity tolerance, balance ,affecting safe and optimal ADL completion. Bed mobility completed with MIN A for management of BLE, STS with CGA-MIN A, amb in room approx 15' with RW with CGA. MAX A required for LB dressing. Pt is performing ADL/functional mobility below PLOF, will benefit from acute OT to address functional deficits and to facilitate optimal ADL performance.      If plan is discharge home, recommend the following:   A little help with walking and/or transfers;A little help with bathing/dressing/bathroom     Functional Status Assessment   Patient has had a recent decline in their functional status and demonstrates the ability to make significant improvements in function in a reasonable and predictable amount of time.     Equipment Recommendations   BSC/3in1;Other (comment) (2WW)     Recommendations for Other Services         Precautions/Restrictions   Precautions Precautions: Fall Recall of Precautions/Restrictions: Intact Restrictions Weight Bearing Restrictions Per Provider Order: No     Mobility Bed Mobility Overal bed mobility: Needs Assistance Bed Mobility: Sit to Supine, Supine to Sit     Supine to sit: Min assist Sit to supine: Min assist        Transfers Overall transfer level: Needs assistance Equipment used: Rolling walker (2 wheels) Transfers: Sit  to/from Stand Sit to Stand: Contact guard assist, Min assist, +2 safety/equipment           General transfer comment: intermittent vcs for technique      Balance Overall balance assessment: Needs assistance Sitting-balance support: Feet supported Sitting balance-Leahy Scale: Fair     Standing balance support: Bilateral upper extremity supported, During functional activity, Reliant on assistive device for balance Standing balance-Leahy Scale: Fair                             ADL either performed or assessed with clinical judgement   ADL Overall ADL's : Needs assistance/impaired     Grooming: Set up;Sitting               Lower Body Dressing: Maximal assistance;Sitting/lateral leans Lower Body Dressing Details (indicate cue type and reason): socks Toilet Transfer: Contact guard assist;Rolling walker (2 wheels)           Functional mobility during ADLs: Contact guard assist;Rolling walker (2 wheels);Cueing for sequencing (15')       Vision Patient Visual Report: No change from baseline       Perception         Praxis         Pertinent Vitals/Pain Pain Assessment Pain Assessment: Faces Faces Pain Scale: Hurts even more Pain Location: right inner thigh Pain Descriptors / Indicators: Discomfort Pain Intervention(s): Monitored during session, Repositioned, Limited activity within patient's tolerance     Extremity/Trunk Assessment Upper Extremity Assessment Upper Extremity Assessment: Generalized weakness   Lower Extremity Assessment Lower Extremity Assessment: Generalized  weakness   Cervical / Trunk Assessment Cervical / Trunk Assessment: Kyphotic   Communication Communication Communication: No apparent difficulties   Cognition Arousal: Alert Behavior During Therapy: WFL for tasks assessed/performed Cognition: No apparent impairments                               Following commands: Intact       Cueing  General  Comments   Cueing Techniques: Verbal cues  pt no 2 L via [place] with spo2 >95%, on RA spo2 88%   Exercises Other Exercises Other Exercises: edu re role of OT, role of rehab, discharge recommendations   Shoulder Instructions      Home Living Family/patient expects to be discharged to:: Private residence Living Arrangements: Spouse/significant other Available Help at Discharge: Family;Available 24 hours/day Type of Home: House Home Access: Stairs to enter [employer] of Steps: 5 Entrance Stairs-Rails: Left Home Layout: Laundry or work area in basement     [employer] Shower/Tub: Chief Strategy Officer: Standard     Home Equipment: Grab bars - tub/shower          Prior Functioning/Environment Prior Level of Function : Independent/Modified Independent                    OT Problem List: Decreased strength;Impaired balance (sitting and/or standing);Decreased activity tolerance;Decreased knowledge of use of DME or AE;Decreased safety awareness   OT Treatment/Interventions: Self-care/ADL training;DME and/or AE instruction;Energy conservation;Therapeutic exercise;Patient/family education;Balance training;Therapeutic activities      OT Goals(Current goals can be found in the care plan section)   Acute Rehab OT Goals Patient Stated Goal: go home OT Goal Formulation: With patient Time For Goal Achievement: 06/07/24 Potential to Achieve Goals: Good ADL Goals Pt Will Perform Upper Body Bathing: with modified independence;sitting Pt Will Perform Lower Body Dressing: with modified independence;sitting/lateral leans;sit to/from stand Pt Will Transfer to Toilet: with modified independence;ambulating Pt Will Perform Toileting - Clothing Manipulation and hygiene: with modified independence;sit to/from stand;sitting/lateral leans   OT Frequency:  Min 2X/week    Co-evaluation PT/OT/SLP Co-Evaluation/Treatment: Yes Reason for Co-Treatment: Complexity of the  patient's impairments (multi-system involvement) PT goals addressed during session: Mobility/safety with mobility OT goals addressed during session: ADL's and self-care      AM-PAC OT 6 Clicks Daily Activity     Outcome Measure Help from another person eating meals?: None Help from another person taking care of personal grooming?: None Help from another person toileting, which includes using toliet, bedpan, or urinal?: A Little Help from another person bathing (including washing, rinsing, drying)?: A Little Help from another person to put on and taking off regular upper body clothing?: None Help from another person to put on and taking off regular lower body clothing?: A Little 6 Click Score: 21   End of Session Equipment Utilized During Treatment: Rolling walker (2 wheels);Oxygen Nurse Communication: Mobility status  Activity Tolerance: Patient tolerated treatment well Patient left: in bed;with call bell/phone within reach;with bed alarm set  OT Visit Diagnosis: Other abnormalities of gait and mobility (R26.89)                Time: 8646-8592 OT Time Calculation (min): 14 min Charges:  OT General Charges $OT Visit: 1 Visit OT Evaluation $OT Eval Moderate Complexity: 1 Mod  Therisa Sheffield, OTD OTR/L  05/24/24, 4:09 PM

## 2024-05-24 NOTE — TOC Initial Note (Addendum)
 Transition of Care [facility]) - Initial/Assessment Note    Patient Details  Name: Dennis Fuller MRN: [id number] Date of Birth: 03-Jan-1946  Transition of Care [facility]) CM/SW Contact:    [name], RN Phone Number: 05/24/2024, 10:49 AM  Clinical Narrative:                 Patient had surgery 9/16 on right leg. His S.O , Dennis Fuller [phone], is at bedside. Patient denies DME and services in the home. He states that he was  independent prior to admission . Patient states that he goes to the [facility] for his PCP but is non-service connected.  Patient received most of his long term medication from the [facility] but use [facility] in [place] for one time medication.   Patient has 6 steps to get into his home. Dennis Fuller states that she will care for him when d/c but want to make sure he can climb the steps to et into his home. She states that one side have rails.   Expected Discharge Plan: Home w Home Health Services Barriers to Discharge: No Barriers Identified   Patient Goals and CMS Choice Patient states their goals for this hospitalization and ongoing recovery are:: Patient states he want to go home          Expected Discharge Plan and Services       Living arrangements for the past 2 months: Single Family Home                                      Prior Living Arrangements/Services Living arrangements for the past 2 months: Single Family Home Lives with:: Self Patient language and need for interpreter reviewed:: No (English) Do you feel safe going back to the place where you live?: Yes               Activities of Daily Living   ADL Screening (condition at time of admission) Independently performs ADLs?: Yes (appropriate for developmental age) Is the patient deaf or have difficulty hearing?: No Does the patient have difficulty seeing, even when wearing glasses/contacts?: No Does the patient have difficulty concentrating, remembering, or making decisions?: No  Permission  Sought/Granted                  Emotional Assessment Appearance:: Appears stated age Attitude/Demeanor/Rapport: Engaged Affect (typically observed): Accepting, Appropriate, Calm Orientation: : Oriented to Self, Oriented to Place, Oriented to  Time, Oriented to Situation      Admission diagnosis:  Atherosclerosis of artery of extremity with rest pain (HCC) [I70.229] Ischemic leg [I99.8] Patient Active Problem List   Diagnosis Date Noted   Hematoma of groin 05/23/2024   Ischemic leg 05/22/2024   Alcohol  abuse 05/22/2024   Atherosclerosis of artery of extremity with rest pain (HCC) 05/19/2024   Tobacco use disorder 05/19/2024   Hypertension    PCP:  [facility], [facility]:   [id number] [place] [place] - [address] [place] [address] Phone: [phone] Fax: [phone]     Social Drivers of Health (SDOH) Social History: SDOH Screenings   Food Insecurity: No Food Insecurity (05/23/2024)  Housing: Low Risk  (05/23/2024)  Transportation Needs: No Transportation Needs (05/23/2024)  Utilities: Not At Risk (05/23/2024)  Financial Resource Strain: Low Risk  (12/06/2023)   Received from [facility]  Social Connections: Moderately Isolated (05/23/2024)  Tobacco Use:  High Risk (05/22/2024)   SDOH Interventions: Housing Interventions: Patient Declined   Readmission Risk Interventions     No data to display

## 2024-05-24 NOTE — Progress Notes (Signed)
 [facility]  - [place] at [facility]   PATIENT NAME: Dennis Fuller    MR#:  [id number]  DATE OF BIRTH:  09-19-1945  SUBJECTIVE:  wife at bedside. Patient overall feels better. Wondering when he can go home. Recommended we have PT OT ambulated and wean oxygen off. Patient agreeable.    VITALS:  Blood pressure (!) 150/65, pulse 88, temperature 98.4 F (36.9 C), temperature source Oral, resp. rate 18, height 5' 10 (1.778 m), weight 67.7 kg, SpO2 99%.  PHYSICAL EXAMINATION:   GENERAL:  78 y.o.-year-old patient with no acute distress.  LUNGS: Normal breath sounds bilaterally, no wheezing CARDIOVASCULAR: S1, S2 normal. No murmur   ABDOMEN: Soft, nontender, nondistended. Bowel sounds present.  EXTREMITIES: No  edema b/l.   Right thigh ecchymosis due to vascular procedure NEUROLOGIC: nonfocal  patient is alert and awake SKIN: as above  LABORATORY PANEL:  CBC Recent Labs  Lab 05/24/24 0406  WBC 12.0*  HGB 8.5*  HCT 24.2*  PLT 151    Chemistries  Recent Labs  Lab 05/24/24 0406  NA 130*  K 3.6  CL 99  CO2 22  GLUCOSE 124*  BUN 14  CREATININE 0.93  CALCIUM  7.8*  MG 2.1     Assessment and Plan  78 y/o male with PMHx significant for PAD with rest pain, tobacco and alcohol  use disorder presenting for right lower extremity angiogram due to thrombosis of right SFA on noninvasive studies. Requires lysis catheter for instillation of thrombolytic therapy overnight. Pt overall improving slowly. Transferred to [facility] for Medical consult f/u  Hospital course so far-- 9/15: Status post right lower extremity angiography for critical limb ischemia w/ placement of thrombolytic catheter via the left femoral. Concern for agitation and alcohol  withdrawal following procedure, so admitted to ICU for further monitoring.  9/16: Status post repeat right lower extremity angiography with vascular surgery following overnight thrombolytic therapy. Stent placement successful to the  right SFA and popliteal arteries. Currently denies N/V, any pain.  9/17--assumed care-- patient sats 98% on 2 L. No respiratory distress. Does not believe chronic oxygen will wean off. Overall improving. Patient is hoping to go home. PT/OT to see patient.  Chronic alcohol  use -- patient currently on Librium  protocol tolerating well. No obvious signs symptoms of withdrawal -- continue monitoring -- advised cessation -- multivitamin, thiamine   Chronic tobacco use -- nicotine  patch -- counseled on cessation  PAD status post lysis catheter 9/15, repeat angioplasty 9/16 with TPA -- now on aspirin  and Plavix  per vascular surgery  Hyperlipidemia -- continue statin    Procedures: as above Family communication : wife at bedside CODE STATUS: full    TOTAL TIME TAKING CARE OF THIS PATIENT: 35 minutes.  >50% time spent on counselling and coordination of care  Note: This dictation was prepared with Dragon dictation along with smaller phrase technology. Any transcriptional errors that result from this process are unintentional.  Leita Blanch M.D    [facility]   CC: Primary care physician; Center, [facility]

## 2024-05-25 DIAGNOSIS — I70229 Atherosclerosis of native arteries of extremities with rest pain, unspecified extremity: Secondary | ICD-10-CM

## 2024-05-25 DIAGNOSIS — Z9889 Other specified postprocedural states: Secondary | ICD-10-CM | POA: Diagnosis not present

## 2024-05-25 DIAGNOSIS — S301XXA Contusion of abdominal wall, initial encounter: Secondary | ICD-10-CM

## 2024-05-25 DIAGNOSIS — I70221 Atherosclerosis of native arteries of extremities with rest pain, right leg: Secondary | ICD-10-CM | POA: Diagnosis not present

## 2024-05-25 LAB — CBC
HCT: 21.6 % — ABNORMAL LOW (ref 39.0–52.0)
Hemoglobin: 7.5 g/dL — ABNORMAL LOW (ref 13.0–17.0)
MCH: 27.9 pg (ref 26.0–34.0)
MCHC: 34.7 g/dL (ref 30.0–36.0)
MCV: 80.3 fL (ref 80.0–100.0)
Platelets: 147 K/uL — ABNORMAL LOW (ref 150–400)
RBC: 2.69 MIL/uL — ABNORMAL LOW (ref 4.22–5.81)
RDW: 15.3 % (ref 11.5–15.5)
WBC: 11.1 K/uL — ABNORMAL HIGH (ref 4.0–10.5)
nRBC: 0 % (ref 0.0–0.2)

## 2024-05-25 LAB — BASIC METABOLIC PANEL WITH GFR
Anion gap: 6 (ref 5–15)
BUN: 17 mg/dL (ref 8–23)
CO2: 24 mmol/L (ref 22–32)
Calcium: 7.7 mg/dL — ABNORMAL LOW (ref 8.9–10.3)
Chloride: 99 mmol/L (ref 98–111)
Creatinine, Ser: 0.73 mg/dL (ref 0.61–1.24)
GFR, Estimated: >60 mL/min (ref >60)
Glucose, Bld: 98 mg/dL (ref 70–99)
Potassium: 3.2 mmol/L — ABNORMAL LOW (ref 3.5–5.1)
Sodium: 129 mmol/L — ABNORMAL LOW (ref 135–145)

## 2024-05-25 LAB — PREPARE RBC (CROSSMATCH)

## 2024-05-25 MED ORDER — SODIUM CHLORIDE 0.9% IV SOLUTION
Freq: Once | INTRAVENOUS | Status: AC
  Administered 2024-05-25: 10 mL/h via INTRAVENOUS

## 2024-05-25 MED ORDER — POTASSIUM CHLORIDE CRYS ER 20 MEQ PO TBCR
40.0000 meq | EXTENDED_RELEASE_TABLET | Freq: Once | ORAL | Status: AC
  Administered 2024-05-25: 40 meq via ORAL
  Filled 2024-05-25: qty 2

## 2024-05-25 NOTE — Progress Notes (Signed)
 [facility]  - [place] at [facility]   PATIENT NAME: Dennis Fuller    MR#:  [id number]  DATE OF BIRTH:  [birth year]  SUBJECTIVE:  wife at bedside. Patient overall feels better.  Hemoglobin down to 7.5. Getting one unit blood transfusion. Denies any complaints with breathing  VITALS:  Blood pressure 130/60, pulse 94, temperature 98.1 F (36.7 C), resp. rate (!) 22, height 5' 10 (1.778 m), weight 67.7 kg, SpO2 (!) 89%.  PHYSICAL EXAMINATION:   GENERAL:  78 y.o.-year-old patient with no acute distress.  LUNGS: Normal breath sounds bilaterally, no wheezing CARDIOVASCULAR: S1, S2 normal. No murmur   ABDOMEN: Soft, nontender, nondistended. EXTREMITIES: No  edema b/l.   Right thigh ecchymosis due to vascular procedure NEUROLOGIC: nonfocal  patient is alert and awake SKIN: as above  LABORATORY PANEL:  CBC Recent Labs  Lab 05/25/24 0504  WBC 11.1*  HGB 7.5*  HCT 21.6*  PLT 147*    Chemistries  Recent Labs  Lab 05/24/24 0406 05/25/24 0504  NA 130* 129*  K 3.6 3.2*  CL 99 99  CO2 22 24  GLUCOSE 124* 98  BUN 14 17  CREATININE 0.93 0.73  CALCIUM  7.8* 7.7*  MG 2.1  --      Assessment and Plan  78 y/o male with PMHx significant for PAD with rest pain, tobacco and alcohol  use disorder presenting for right lower extremity angiogram due to thrombosis of right SFA on noninvasive studies. Requires lysis catheter for instillation of thrombolytic therapy overnight. Pt overall improving slowly. Transferred to [facility] for Medical consult f/u  Hospital course so far-- 9/15: Status post right lower extremity angiography for critical limb ischemia w/ placement of thrombolytic catheter via the left femoral. Concern for agitation and alcohol  withdrawal following procedure, so admitted to ICU for further monitoring.  9/16: Status post repeat right lower extremity angiography with vascular surgery following overnight thrombolytic therapy. Stent placement successful to the right  SFA and popliteal arteries. Currently denies N/V, any pain.  9/17--assumed care-- patient sats 98% on 2 L. No respiratory distress. Does not believe chronic oxygen will wean off. Overall improving. Patient is hoping to go home. PT/OT to see patient. 9/18-- hemoglobin 7.5. Getting one unit blood transfusion. Tolerating PO diet. Sats remain more than 90% on room air while ambulating with PT yesterday. No respiratory distress no fever  Chronic alcohol  use -- patient currently on Librium  protocol tolerating well. No obvious signs symptoms of withdrawal -- continue monitoring -- advised cessation -- multivitamin, thiamine   Chronic tobacco use -- nicotine  patch -- counseled on cessation  PAD status post lysis catheter 9/15, repeat angioplasty 9/16 with TPA -- now on aspirin  and Plavix  per vascular surgery  Hyperlipidemia -- continue statin    Procedures: as above Family communication : wife at bedside CODE STATUS: full    TOTAL TIME TAKING CARE OF THIS PATIENT: 35 minutes.  >50% time spent on counselling and coordination of care  Note: This dictation was prepared with Dragon dictation along with smaller phrase technology. Any transcriptional errors that result from this process are unintentional.  Leita Blanch M.D    [facility]   CC: Primary care physician; Center, [facility]

## 2024-05-25 NOTE — Progress Notes (Signed)
 Good day. Up with PT and walked. Pain medication given only once today. Sat up in chair x 2 hours. Left thigh bruising still present and  improving.

## 2024-05-25 NOTE — Progress Notes (Signed)
 PHARMACY CONSULT NOTE - ELECTROLYTES  Pharmacy Consult for Electrolyte Monitoring and Replacement   Recent Labs: Height: 5' 10 (177.8 cm) Weight: 67.7 kg (149 lb 4 oz) IBW/kg (Calculated) : 73 Estimated Creatinine Clearance: 74 mL/min (by C-G formula based on SCr of 0.73 mg/dL). Potassium (mmol/L)  Date Value  05/25/2024 3.2 (L)   Magnesium  (mg/dL)  Date Value  90/82/7974 2.1   Calcium  (mg/dL)  Date Value  90/81/7974 7.7 (L)   Albumin (g/dL)  Date Value  90/93/7975 4.5   Phosphorus (mg/dL)  Date Value  90/82/7974 3.2   Sodium (mmol/L)  Date Value  05/25/2024 129 (L)    Assessment  Dennis Fuller is a 78 y.o. male presenting with ischemic leg. PMH significant for PVD, COPD, HTN, tobacco  and alcohol  use disorder. Pharmacy has been consulted to monitor and replace electrolytes while in CCU.  Goal of Therapy: Electrolytes WNL  Plan:  40 mEq po KCl x 1 Because this consult was generated as part of an ICU order set and patient is transferring pharmacy will sign off for now Please feel free to reach out if any further assistance is needed  Thank you for allowing pharmacy to be a part of this patient's care.   Adriana Bolster, PharmD, BCPS  05/25/2024 7:51 AM

## 2024-05-25 NOTE — Progress Notes (Signed)
 Physical Therapy Treatment Patient Details Name: Dennis Fuller MRN: [id number] DOB: 1945/10/10 Today's Date: 05/25/2024   History of Present Illness Patient is a 78 year old male status post right lower extremity angiography for critical limb ischemia w/ placement of thrombolytic catheter. PMH: tobacco and alcohol  use disorder    PT Comments  Patient is agreeable to PT session. He reports pain is well controlled. Increased ambulation distance today using rolling walker. Sp02 in the 90's while ambulating on room air. He is still hopeful for return home. PT will continue to follow to maximize independence and to decrease caregiver burden.    If plan is discharge home, recommend the following: A little help with walking and/or transfers;A little help with bathing/dressing/bathroom;Assist for transportation;Help with stairs or ramp for entrance   Can travel by private vehicle        Equipment Recommendations  Rolling walker (2 wheels)    Recommendations for Other Services       Precautions / Restrictions Precautions Precautions: Fall Recall of Precautions/Restrictions: Intact Restrictions Weight Bearing Restrictions Per Provider Order: No     Mobility  Bed Mobility Overal bed mobility: Needs Assistance Bed Mobility: Supine to Sit     Supine to sit: Min assist, HOB elevated     General bed mobility comments: intermittent assistance just for trunk support initially to sit upright. cues for technique    Transfers Overall transfer level: Needs assistance Equipment used: Rolling walker (2 wheels) Transfers: Sit to/from Stand, Bed to chair/wheelchair/BSC Sit to Stand: Min assist   Step pivot transfers: Contact guard assist       General transfer comment: initial lifting assistance required to stand from bed. increased independence with pivot transfer from chair with arms to recliner chair using rolling walker. cues for safety    Ambulation/Gait Ambulation/Gait assistance:  Contact guard assist Gait Distance (Feet): 50 Feet Assistive device: Rolling walker (2 wheels) Gait Pattern/deviations: Step-through pattern, Trunk flexed, Decreased stride length Gait velocity: decreased     General Gait Details: reinforcement for using rolling walker for support. Sp02 in the 90's with ambulation on room air with no significant shortness of breath noted   Stairs             Wheelchair Mobility     Tilt Bed    Modified Rankin (Stroke Patients Only)       Balance Overall balance assessment: Needs assistance Sitting-balance support: Feet supported Sitting balance-Leahy Scale: Fair     Standing balance support: Bilateral upper extremity supported Standing balance-Leahy Scale: Fair Standing balance comment: with rolling walker for support                            Communication Communication Communication: No apparent difficulties  Cognition Arousal: Alert Behavior During Therapy: WFL for tasks assessed/performed   PT - Cognitive impairments: No apparent impairments                         Following commands: Intact      Cueing Cueing Techniques: Verbal cues  Exercises      General Comments General comments (skin integrity, edema, etc.): Sp02 97% on room air at end of session and nurse okay with leaving supplemental oxygen off at this time      Pertinent Vitals/Pain Pain Assessment Pain Assessment: No/denies pain    Home Living  Prior Function            PT Goals (current goals can now be found in the care plan section) Acute Rehab PT Goals Patient Stated Goal: home PT Goal Formulation: With patient Time For Goal Achievement: 06/07/24 Potential to Achieve Goals: Good Progress towards PT goals: Progressing toward goals    Frequency    Min 2X/week      PT Plan      Co-evaluation              AM-PAC PT 6 Clicks Mobility   Outcome Measure  Help needed  turning from your back to your side while in a flat bed without using bedrails?: A Little Help needed moving from lying on your back to sitting on the side of a flat bed without using bedrails?: A Little Help needed moving to and from a bed to a chair (including a wheelchair)?: A Little Help needed standing up from a chair using your arms (e.g., wheelchair or bedside chair)?: A Little Help needed to walk in hospital room?: A Little Help needed climbing 3-5 steps with a railing? : A Little 6 Click Score: 18    End of Session Equipment Utilized During Treatment: Gait belt Activity Tolerance: Patient tolerated treatment well Patient left: in chair;with call bell/phone within reach;with chair alarm set Nurse Communication: Mobility status PT Visit Diagnosis: Difficulty in walking, not elsewhere classified (R26.2);Other abnormalities of gait and mobility (R26.89)     Time: 9088-9062 PT Time Calculation (min) (ACUTE ONLY): 26 min  Charges:    $Therapeutic Activity: 23-37 mins PT General Charges $$ ACUTE PT VISIT: 1 Visit                    Dennis Fuller, PT, MPT    Dennis Fuller 05/25/2024, 11:08 AM

## 2024-05-25 NOTE — Progress Notes (Signed)
 [facility]  Daily Progress Note   Subjective  -   Still having reperfusion pain to right foot.  Swelling is quite mild. Did better with PT Hgb was 7.5 today. Getting 1 unit PRBC.    Objective Vitals:   05/25/24 1300 05/25/24 1400 05/25/24 1500 05/25/24 1515  BP: (!) 137/53 127/71 132/60 130/60  Pulse: 100 (!) 110 94   Resp: 17 20 (!) 22   Temp:   98.2 F (36.8 C) 98.1 F (36.7 C)  TempSrc:      SpO2: 94% 100% (!) 89%   Weight:      Height:        Intake/Output Summary (Last 24 hours) at 05/25/2024 1627 Last data filed at 05/25/2024 1510 Gross per 24 hour  Intake 735 ml  Output 1150 ml  Net -415 ml    PULM  CTAB CV  tachycardic VASC  Mild RLE swelling. 2+ right PT pulse  Laboratory CBC    Component Value Date/Time   WBC 11.1 (H) 05/25/2024 0504   HGB 7.5 (L) 05/25/2024 0504   HCT 21.6 (L) 05/25/2024 0504   PLT 147 (L) 05/25/2024 0504    BMET    Component Value Date/Time   NA 129 (L) 05/25/2024 0504   K 3.2 (L) 05/25/2024 0504   CL 99 05/25/2024 0504   CO2 24 05/25/2024 0504   GLUCOSE 98 05/25/2024 0504   BUN 17 05/25/2024 0504   CREATININE 0.73 05/25/2024 0504   CALCIUM  7.7 (L) 05/25/2024 0504   GFRNONAA >60 05/25/2024 0504    Assessment/Planning: POD #2/3 s/p RLE angiogram, revascularization  Good strong pulses Some reperfusion pain Did better with PT today Did get some blood for procedural blood loss.  No active ongoing bleeding Plan to keep tonight and likely home tomorrow    Selinda Gu  05/25/2024, 4:27 PM

## 2024-05-25 NOTE — TOC Progression Note (Signed)
 Transition of Care [facility]) - Progression Note    Patient Details  Name: Sylvestre Rathgeber MRN: [id number] Date of Birth: Oct 12, 1945  Transition of Care [facility]) CM/SW Contact  K'La JINNY Ruts, LCSW Phone Number: 05/25/2024, 4:18 PM  Clinical Narrative:    Chart reviewed. I spoke with the patient and the patient significant other. I introduced my self, my role, reason for consult, and HIPAA. The patient was not very verbal. I informed the patient of HH recommendation. The patient significant other was interested in [facility] for the patient. The patient and his significant other identified [facility].   I have reached out to [place]  from [facility] and she has accepted the patient for [facility] physical therapy.   There are no other TOC needs at this time.    Expected Discharge Plan: Home w Home Health Services Barriers to Discharge: No Barriers Identified               Expected Discharge Plan and Services       Living arrangements for the past 2 months: Single Family Home                                       Social Drivers of Health (SDOH) Interventions SDOH Screenings   Food Insecurity: No Food Insecurity (05/23/2024)  Housing: Low Risk  (05/23/2024)  Transportation Needs: No Transportation Needs (05/23/2024)  Utilities: Not At Risk (05/23/2024)  Financial Resource Strain: Low Risk  (12/06/2023)   Received from [facility]  Social Connections: Moderately Isolated (05/23/2024)  Tobacco Use: High Risk (05/22/2024)    Readmission Risk Interventions     No data to display

## 2024-05-26 DIAGNOSIS — S301XXA Contusion of abdominal wall, initial encounter: Secondary | ICD-10-CM | POA: Diagnosis not present

## 2024-05-26 DIAGNOSIS — I70229 Atherosclerosis of native arteries of extremities with rest pain, unspecified extremity: Secondary | ICD-10-CM | POA: Diagnosis not present

## 2024-05-26 DIAGNOSIS — I70221 Atherosclerosis of native arteries of extremities with rest pain, right leg: Secondary | ICD-10-CM | POA: Diagnosis not present

## 2024-05-26 DIAGNOSIS — Z9889 Other specified postprocedural states: Secondary | ICD-10-CM | POA: Diagnosis not present

## 2024-05-26 LAB — TYPE AND SCREEN
ABO/RH(D): A POS
Antibody Screen: NEGATIVE
Unit division: 0

## 2024-05-26 LAB — BPAM RBC
Blood Product Expiration Date: 202510182359
ISSUE DATE / TIME: 202509181128
Unit Type and Rh: 6200

## 2024-05-26 LAB — HEMOGLOBIN AND HEMATOCRIT, BLOOD
HCT: 27.7 % — ABNORMAL LOW (ref 39.0–52.0)
HCT: 27.7 % — ABNORMAL LOW (ref 39.0–52.0)
Hemoglobin: 9.5 g/dL — ABNORMAL LOW (ref 13.0–17.0)
Hemoglobin: 9.6 g/dL — ABNORMAL LOW (ref 13.0–17.0)

## 2024-05-26 MED ORDER — NICOTINE 14 MG/24HR TD PT24: 14.0000 mg | MEDICATED_PATCH | Freq: Every day | TRANSDERMAL | 0 refills | Status: AC

## 2024-05-26 MED ORDER — OXYCODONE-ACETAMINOPHEN 5-325 MG PO TABS: 1.0000 | ORAL_TABLET | Freq: Three times a day (TID) | ORAL | 0 refills | Status: DC | PRN

## 2024-05-26 NOTE — Progress Notes (Addendum)
 0800 Awake and alert . H&H stable. 1015 Discharge teaching done. Questions answered. [id number] Discharge home with spouse.
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
## 2024-05-26 NOTE — Progress Notes (Signed)
 [facility]  - [place] [facility]   PATIENT NAME: Dennis Fuller    MR#:  [id number]  DATE OF BIRTH:  1946/03/18  SUBJECTIVE:  No family at bedside. Patient overall feels better.  Hemoglobin stable after 1 unit BT  Denies any complaints with breathing Eager to go home  VITALS:  Blood pressure 114/65, pulse 75, temperature 97.8 F (36.6 C), temperature source Oral, resp. rate 20, height 5' 10 (1.778 m), weight 67.7 kg, SpO2 97%.  PHYSICAL EXAMINATION:   GENERAL:  78 y.o.-year-old patient with no acute distress.  LUNGS: Normal breath sounds bilaterall CARDIOVASCULAR: S1, S2 normal. No murmur   ABDOMEN: Soft, nontender, nondistended. EXTREMITIES: No  edema b/l.   Right thigh ecchymosis due to vascular procedure NEUROLOGIC: nonfocal  patient is alert and awake SKIN: as above  LABORATORY PANEL:  CBC Recent Labs  Lab 05/25/24 0504 05/26/24 0533 05/26/24 0813  WBC 11.1*  --   --   HGB 7.5*   < > 9.6*  HCT 21.6*   < > 27.7*  PLT 147*  --   --    < > = values in this interval not displayed.    Chemistries  Recent Labs  Lab 05/24/24 0406 05/25/24 0504  NA 130* 129*  K 3.6 3.2*  CL 99 99  CO2 22 24  GLUCOSE 124* 98  BUN 14 17  CREATININE 0.93 0.73  CALCIUM  7.8* 7.7*  MG 2.1  --      Assessment and Plan  78 y/o male with PMHx significant for PAD with rest pain, tobacco and alcohol  use disorder presenting for right lower extremity angiogram due to thrombosis of right SFA on noninvasive studies. Requires lysis catheter for instillation of thrombolytic therapy overnight. Pt overall improving slowly. Transferred to [facility] for Medical consult f/u  Hospital course so far-- 9/15: Status post right lower extremity angiography for critical limb ischemia w/ placement of thrombolytic catheter via the left femoral. Concern for agitation and alcohol  withdrawal following procedure, so admitted to ICU for further monitoring.  9/16: Status post repeat right lower  extremity angiography with vascular surgery following overnight thrombolytic therapy. Stent placement successful to the right SFA and popliteal arteries. Currently denies N/V, any pain.  9/17--assumed care-- patient sats 98% on 2 L. No respiratory distress. Does not believe chronic oxygen will wean off. Overall improving. Patient is hoping to go home. PT/OT to see patient. 9/18-- hemoglobin 7.5. Getting one unit blood transfusion. Tolerating PO diet. Sats remain more than 90% on room air while ambulating with PT yesterday. No respiratory distress no fever 9/19-- hgb >9.0 x2 after 1 unit BT. Sats >92% on RA. Overall feels better  Chronic alcohol  use -- patient currently on Librium  protocol tolerating well. No obvious signs symptoms of withdrawal -- continue monitoring -- advised cessation -- multivitamin, thiamine   Chronic tobacco use -- nicotine  patch -- counseled on cessation  PAD status post lysis catheter 9/15, repeat angioplasty 9/16 with TPA -- now on aspirin  and Plavix  per vascular surgery  Hyperlipidemia -- continue statin  Vascular plans to d/c home today. Pt advised to f/u PCP at [facility]. IM will sign off     TOTAL TIME TAKING CARE OF THIS PATIENT: 35 minutes.  >50% time spent on counselling and coordination of care  Note: This dictation was prepared with Dragon dictation along with smaller phrase technology. Any transcriptional errors that result from this process are unintentional.  Leita Blanch M.D    [facility]   CC:  Primary care physician; Center, [facility]

## 2024-05-26 NOTE — Discharge Instructions (Addendum)
 Vascular Surgery Discharge Instructions:   Do not lift anything heavy.  Do not lift anything heavy for the next 2 weeks.  Do not lift anything more than a gallon of milk.  You may discharge tomorrow when you get home.  On Saturday, 05/27/2024 shower with the dressing to your groins in place and remove immediately after getting out of the shower.  Pat completely dry and place Band-Aids over the puncture sites for the next 3 days.  You are being discharged on aspirin  81 mg daily, Plavix  75 mg daily and Lipitor 10 mg daily.  Do not miss or skip taking any of these medications as it will alter the outcome of your procedure.  Do not drive for the next 2 weeks.  Do not drive if you are taking any narcotic medication.  Follow-up with vein and vascular surgery as scheduled.

## 2024-05-31 ENCOUNTER — Telehealth (INDEPENDENT_AMBULATORY_CARE_PROVIDER_SITE_OTHER): Payer: Self-pay | Admitting: Vascular Surgery

## 2024-05-31 NOTE — Telephone Encounter (Signed)
 Annabella Gander from [facility] called for verbal order, physical therapy  1x  for 1 week 2x  for 3 week 1x  for 3 week  Phone number [phone]

## 2024-06-01 NOTE — Telephone Encounter (Signed)
 Gave the approval for the verbal order to [facility]

## 2024-06-05 ENCOUNTER — Telehealth (INDEPENDENT_AMBULATORY_CARE_PROVIDER_SITE_OTHER): Payer: Self-pay | Admitting: Vascular Surgery

## 2024-06-05 NOTE — Telephone Encounter (Signed)
 Notified Jori of the approval for the verbal order.

## 2024-06-05 NOTE — Telephone Encounter (Signed)
 Jori from [facility] requesting verbal order for occupational therapy for this week.   Phone [phone]

## 2024-06-06 ENCOUNTER — Telehealth (INDEPENDENT_AMBULATORY_CARE_PROVIDER_SITE_OTHER): Payer: Self-pay | Admitting: Vascular Surgery

## 2024-06-06 NOTE — Telephone Encounter (Signed)
 Annabella Gander from [facility] called and left a message with an update on patient. She is there for physical therapy. She stated that patient reports that pain in his left leg with a pain scale of 9. It usually happens at night. It does not matter what position he is sleeping, the pain comes. He is not sure what is causing the pain.   Annabella Gander [phone]

## 2024-06-07 NOTE — Telephone Encounter (Signed)
 Spoken to Tiffany with [facility] and notified Dr Fransisca comments. Verbalized understanding. She stated that she may call again because she will be seeing patient again today.

## 2024-06-07 NOTE — Telephone Encounter (Addendum)
 Call from [place] OT with [facility], she reports shed like to make the office aware patient is still complaining of pain 7/10 with activity and 3/10 at rest, she reports he has been using Oxycodone  but is not lasting through the night and is having to use another during the night when he wakes and has also been using advil  even though he has been instructed not to do so. She states if needed she may be reached at [phone]

## 2024-06-20 ENCOUNTER — Telehealth (INDEPENDENT_AMBULATORY_CARE_PROVIDER_SITE_OTHER): Payer: Self-pay

## 2024-06-20 NOTE — Telephone Encounter (Signed)
 Amy physical therapist called from [facility] in reference to concerns about patients pain and inability to sleep at this time. She stated patient is having pain in his R foot, his middle three toes are cold & numb. He is also having pain in his L leg in the calf and upper thigh area at this time. She is concerned about his ongoing pain post surgery in reference to healing or surgery complications. Please Advise.

## 2024-06-20 NOTE — Telephone Encounter (Signed)
 Spoke with Dr. Marea recommendation for patients concerns in reference to pain was follow up with ABI, patient is scheduled Friday 10/17 @ 1030 AM. Contacted Amy from [facility] his physical therapist and left her a  message. I also made contact with patient at this time to remind him of his appointment Friday in office and if anything changes with his pain to contact the office for further advice.

## 2024-06-22 ENCOUNTER — Other Ambulatory Visit (INDEPENDENT_AMBULATORY_CARE_PROVIDER_SITE_OTHER): Payer: Self-pay | Admitting: Vascular Surgery

## 2024-06-22 DIAGNOSIS — Z9889 Other specified postprocedural states: Secondary | ICD-10-CM

## 2024-06-23 ENCOUNTER — Ambulatory Visit (INDEPENDENT_AMBULATORY_CARE_PROVIDER_SITE_OTHER)

## 2024-06-23 ENCOUNTER — Encounter (INDEPENDENT_AMBULATORY_CARE_PROVIDER_SITE_OTHER)

## 2024-06-23 ENCOUNTER — Encounter (INDEPENDENT_AMBULATORY_CARE_PROVIDER_SITE_OTHER): Payer: Self-pay | Admitting: Vascular Surgery

## 2024-06-23 ENCOUNTER — Ambulatory Visit (INDEPENDENT_AMBULATORY_CARE_PROVIDER_SITE_OTHER): Admitting: Vascular Surgery

## 2024-06-23 VITALS — BP 109/64 | HR 90 | Ht 70.0 in | Wt 152.6 lb

## 2024-06-23 DIAGNOSIS — Z9889 Other specified postprocedural states: Secondary | ICD-10-CM | POA: Diagnosis not present

## 2024-06-23 DIAGNOSIS — I10 Essential (primary) hypertension: Secondary | ICD-10-CM

## 2024-06-23 DIAGNOSIS — F17200 Nicotine dependence, unspecified, uncomplicated: Secondary | ICD-10-CM

## 2024-06-23 DIAGNOSIS — I70229 Atherosclerosis of native arteries of extremities with rest pain, unspecified extremity: Secondary | ICD-10-CM | POA: Diagnosis not present

## 2024-06-23 DIAGNOSIS — F17210 Nicotine dependence, cigarettes, uncomplicated: Secondary | ICD-10-CM | POA: Diagnosis not present

## 2024-06-23 DIAGNOSIS — I739 Peripheral vascular disease, unspecified: Secondary | ICD-10-CM | POA: Diagnosis not present

## 2024-06-23 NOTE — Assessment & Plan Note (Signed)
 His ABIs today are markedly improved on the right up to 0.93 with multiphasic waveforms.  These were undetectable previously.  His left ABI remains moderately reduced at 0.59 with monophasic waveforms.  He does still have sluggish digital signals on the right. At this point, he may have microvascular and small vessel disease on the right but nothing that would benefit from any intervention and there is not much further we can do to improve his blood flow.  I would prefer he take the Plavix  every day rather than every other day.  We could try a different anticoagulant if you prefer.  He will continue his aspirin  and Lipitor.  His left leg has moderately reduced flow, but does not have any limb threatening symptoms on that side and no intervention is currently planned.  Smoking cessation again recommended.  Follow-up in 3 months with ABIs.
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
## 2024-06-26 LAB — VAS US ABI WITH/WO TBI
Left ABI: 0.59
Right ABI: 0.93

## 2024-09-04 ENCOUNTER — Telehealth (INDEPENDENT_AMBULATORY_CARE_PROVIDER_SITE_OTHER): Payer: Self-pay

## 2024-09-04 NOTE — Telephone Encounter (Signed)
 Patient called into nurse line to report complaint of swelling and foot pain. Call back to patient Patient reports swelling began about a month ago pain staying at a level 5/10 does escalate during activity pain is located on right foot/leg, patient reports he does not wear compression currently stockings thinks he has swelling is is mainly across toes and outer side of foot. Please advise

## 2024-09-04 NOTE — Telephone Encounter (Signed)
 Given his history of PAD, he should come in with ABIs and DVT study

## 2024-09-07 ENCOUNTER — Encounter (INDEPENDENT_AMBULATORY_CARE_PROVIDER_SITE_OTHER): Payer: Self-pay | Admitting: Vascular Surgery

## 2024-09-08 ENCOUNTER — Other Ambulatory Visit (INDEPENDENT_AMBULATORY_CARE_PROVIDER_SITE_OTHER): Payer: Self-pay | Admitting: Nurse Practitioner

## 2024-09-08 MED ORDER — OXYCODONE-ACETAMINOPHEN 5-325 MG PO TABS: 1.0000 | ORAL_TABLET | Freq: Two times a day (BID) | ORAL | 0 refills | Status: DC | PRN

## 2024-09-08 NOTE — Telephone Encounter (Signed)
 Let's try to move his visit up

## 2024-09-14 ENCOUNTER — Encounter (INDEPENDENT_AMBULATORY_CARE_PROVIDER_SITE_OTHER): Payer: Self-pay | Admitting: Nurse Practitioner

## 2024-09-14 ENCOUNTER — Other Ambulatory Visit (INDEPENDENT_AMBULATORY_CARE_PROVIDER_SITE_OTHER): Payer: Self-pay | Admitting: Vascular Surgery

## 2024-09-14 ENCOUNTER — Ambulatory Visit (INDEPENDENT_AMBULATORY_CARE_PROVIDER_SITE_OTHER): Admitting: Nurse Practitioner

## 2024-09-14 ENCOUNTER — Ambulatory Visit (INDEPENDENT_AMBULATORY_CARE_PROVIDER_SITE_OTHER)

## 2024-09-14 ENCOUNTER — Encounter (INDEPENDENT_AMBULATORY_CARE_PROVIDER_SITE_OTHER): Payer: Self-pay | Admitting: Vascular Surgery

## 2024-09-14 VITALS — BP 129/69 | HR 93 | Resp 18 | Ht 70.0 in | Wt 148.6 lb

## 2024-09-14 DIAGNOSIS — I70229 Atherosclerosis of native arteries of extremities with rest pain, unspecified extremity: Secondary | ICD-10-CM

## 2024-09-14 DIAGNOSIS — F17200 Nicotine dependence, unspecified, uncomplicated: Secondary | ICD-10-CM | POA: Diagnosis not present

## 2024-09-14 DIAGNOSIS — Z9889 Other specified postprocedural states: Secondary | ICD-10-CM

## 2024-09-14 DIAGNOSIS — I10 Essential (primary) hypertension: Secondary | ICD-10-CM

## 2024-09-14 DIAGNOSIS — I70221 Atherosclerosis of native arteries of extremities with rest pain, right leg: Secondary | ICD-10-CM | POA: Diagnosis not present

## 2024-09-14 DIAGNOSIS — I739 Peripheral vascular disease, unspecified: Secondary | ICD-10-CM | POA: Diagnosis not present

## 2024-09-14 LAB — VAS US ABI WITH/WO TBI
Left ABI: 0.62
Right ABI: 1.08

## 2024-09-14 MED ORDER — LIDOCAINE-PRILOCAINE 2.5-2.5 % EX CREA: 1.0000 | TOPICAL_CREAM | CUTANEOUS | 0 refills | Status: AC | PRN

## 2024-09-16 ENCOUNTER — Encounter (INDEPENDENT_AMBULATORY_CARE_PROVIDER_SITE_OTHER): Payer: Self-pay

## 2024-09-18 ENCOUNTER — Telehealth (INDEPENDENT_AMBULATORY_CARE_PROVIDER_SITE_OTHER): Payer: Self-pay

## 2024-09-18 ENCOUNTER — Other Ambulatory Visit (INDEPENDENT_AMBULATORY_CARE_PROVIDER_SITE_OTHER): Payer: Self-pay | Admitting: Nurse Practitioner

## 2024-09-18 DIAGNOSIS — I70229 Atherosclerosis of native arteries of extremities with rest pain, unspecified extremity: Secondary | ICD-10-CM

## 2024-09-18 MED ORDER — CILOSTAZOL 100 MG PO TABS: 100.0000 mg | ORAL_TABLET | Freq: Two times a day (BID) | ORAL | 2 refills | Status: AC

## 2024-09-18 MED ORDER — GABAPENTIN 300 MG PO CAPS: 300.0000 mg | ORAL_CAPSULE | Freq: Every day | ORAL | 2 refills | Status: AC

## 2024-09-18 NOTE — Telephone Encounter (Signed)
 Patients spouse called at this time in reference to prescription for patient for gabapentin  for his PAD. She stated she sent a message in MyChart last week to the provider about the prescription. I called [facility] in [place] at this time and the prescription is being filled at this time and they will contact the patient when it is available. Tried to contact [place] at this time, was unable to leave a voicemail but will try back later.

## 2024-09-18 NOTE — Telephone Encounter (Signed)
 Called and spoke to patients wife at this time and let her know the gabapentin  is being filled at [facility] [place], and they will give her a call when it is ready.

## 2024-09-21 ENCOUNTER — Other Ambulatory Visit (INDEPENDENT_AMBULATORY_CARE_PROVIDER_SITE_OTHER): Payer: Self-pay | Admitting: Nurse Practitioner

## 2024-09-21 ENCOUNTER — Encounter (INDEPENDENT_AMBULATORY_CARE_PROVIDER_SITE_OTHER): Payer: Self-pay | Admitting: Vascular Surgery

## 2024-09-21 DIAGNOSIS — I70229 Atherosclerosis of native arteries of extremities with rest pain, unspecified extremity: Secondary | ICD-10-CM

## 2024-09-21 MED ORDER — OXYCODONE-ACETAMINOPHEN 5-325 MG PO TABS: 1.0000 | ORAL_TABLET | Freq: Two times a day (BID) | ORAL | 0 refills | Status: DC | PRN

## 2024-09-22 NOTE — Telephone Encounter (Signed)
 Call them and verify they have the referral.  It is in the system, I placed it yesterday.  Call and inform the patient once you talk with them

## 2024-09-24 ENCOUNTER — Encounter (INDEPENDENT_AMBULATORY_CARE_PROVIDER_SITE_OTHER): Payer: Self-pay | Admitting: Nurse Practitioner
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
## 2024-09-25 ENCOUNTER — Encounter: Attending: Physician Assistant | Admitting: Physician Assistant

## 2024-09-25 DIAGNOSIS — I10 Essential (primary) hypertension: Secondary | ICD-10-CM | POA: Insufficient documentation

## 2024-09-25 DIAGNOSIS — L97518 Non-pressure chronic ulcer of other part of right foot with other specified severity: Secondary | ICD-10-CM | POA: Insufficient documentation

## 2024-09-25 DIAGNOSIS — F17210 Nicotine dependence, cigarettes, uncomplicated: Secondary | ICD-10-CM | POA: Insufficient documentation

## 2024-09-25 DIAGNOSIS — I7389 Other specified peripheral vascular diseases: Secondary | ICD-10-CM | POA: Insufficient documentation

## 2024-09-25 DIAGNOSIS — J4489 Other specified chronic obstructive pulmonary disease: Secondary | ICD-10-CM | POA: Insufficient documentation

## 2024-09-26 ENCOUNTER — Encounter: Admitting: Physician Assistant

## 2024-09-26 ENCOUNTER — Other Ambulatory Visit: Payer: Self-pay | Admitting: Physician Assistant

## 2024-09-26 ENCOUNTER — Encounter (INDEPENDENT_AMBULATORY_CARE_PROVIDER_SITE_OTHER)

## 2024-09-26 ENCOUNTER — Ambulatory Visit (INDEPENDENT_AMBULATORY_CARE_PROVIDER_SITE_OTHER): Admitting: Vascular Surgery

## 2024-09-26 DIAGNOSIS — L97518 Non-pressure chronic ulcer of other part of right foot with other specified severity: Secondary | ICD-10-CM

## 2024-09-26 NOTE — Telephone Encounter (Signed)
[id number] °

## 2024-09-27 ENCOUNTER — Encounter (INDEPENDENT_AMBULATORY_CARE_PROVIDER_SITE_OTHER): Payer: Self-pay

## 2024-09-28 ENCOUNTER — Telehealth (INDEPENDENT_AMBULATORY_CARE_PROVIDER_SITE_OTHER): Payer: Self-pay

## 2024-09-28 NOTE — Telephone Encounter (Addendum)
 I attempted to contact the patient to schedule him for a RLE angio with Dr. Marea. A message was left for a return call. Patient returned my call and is now scheduled with Dr. Marea on 10/05/24 with a 12:15 pm arrival time to the [facility]. Pre-procedure instructions were discussed and will be sent to Mychart and mailed.

## 2024-09-29 ENCOUNTER — Ambulatory Visit
Admission: RE | Admit: 2024-09-29 | Discharge: 2024-09-29 | Disposition: A | Source: Ambulatory Visit | Attending: Physician Assistant

## 2024-09-29 ENCOUNTER — Other Ambulatory Visit (INDEPENDENT_AMBULATORY_CARE_PROVIDER_SITE_OTHER): Payer: Self-pay | Admitting: Nurse Practitioner

## 2024-09-29 DIAGNOSIS — L97518 Non-pressure chronic ulcer of other part of right foot with other specified severity: Secondary | ICD-10-CM | POA: Diagnosis present

## 2024-09-29 MED ORDER — OXYCODONE-ACETAMINOPHEN 5-325 MG PO TABS: 1.0000 | ORAL_TABLET | Freq: Two times a day (BID) | ORAL | 0 refills | Status: DC | PRN

## 2024-09-29 MED ORDER — GADOBUTROL 1 MMOL/ML IV SOLN
7.0000 mL | Freq: Once | INTRAVENOUS | Status: AC | PRN
  Administered 2024-09-29: 6 mL via INTRAVENOUS

## 2024-09-29 NOTE — Telephone Encounter (Signed)
 Refill sent

## 2024-10-03 ENCOUNTER — Encounter: Admitting: Physician Assistant

## 2024-10-05 ENCOUNTER — Ambulatory Visit: Admitting: Anesthesiology

## 2024-10-05 ENCOUNTER — Other Ambulatory Visit: Payer: Self-pay

## 2024-10-05 ENCOUNTER — Ambulatory Visit
Admission: RE | Admit: 2024-10-05 | Discharge: 2024-10-05 | Disposition: A | Discharge status: Home / Self Care | Attending: Vascular Surgery | Admitting: Vascular Surgery

## 2024-10-05 ENCOUNTER — Encounter: Payer: Self-pay | Admitting: Vascular Surgery

## 2024-10-05 ENCOUNTER — Encounter: Admission: RE | Disposition: A | Payer: Self-pay | Source: Home / Self Care | Attending: Vascular Surgery

## 2024-10-05 DIAGNOSIS — F17210 Nicotine dependence, cigarettes, uncomplicated: Secondary | ICD-10-CM | POA: Insufficient documentation

## 2024-10-05 DIAGNOSIS — Z79899 Other long term (current) drug therapy: Secondary | ICD-10-CM | POA: Insufficient documentation

## 2024-10-05 DIAGNOSIS — I70229 Atherosclerosis of native arteries of extremities with rest pain, unspecified extremity: Secondary | ICD-10-CM

## 2024-10-05 DIAGNOSIS — I10 Essential (primary) hypertension: Secondary | ICD-10-CM | POA: Insufficient documentation

## 2024-10-05 DIAGNOSIS — I70261 Atherosclerosis of native arteries of extremities with gangrene, right leg: Secondary | ICD-10-CM | POA: Insufficient documentation

## 2024-10-05 DIAGNOSIS — L97518 Non-pressure chronic ulcer of other part of right foot with other specified severity: Secondary | ICD-10-CM | POA: Insufficient documentation

## 2024-10-05 LAB — CREATININE, SERUM
Creatinine, Ser: 0.92 mg/dL (ref 0.61–1.24)
GFR, Estimated: >60 mL/min

## 2024-10-05 LAB — HEMOGLOBIN AND HEMATOCRIT, BLOOD
HCT: 20.8 % — ABNORMAL LOW (ref 39.0–52.0)
Hemoglobin: 7 g/dL — ABNORMAL LOW (ref 13.0–17.0)

## 2024-10-05 LAB — TYPE AND SCREEN
ABO/RH(D): A POS
Antibody Screen: NEGATIVE

## 2024-10-05 LAB — BUN: BUN: 17 mg/dL (ref 8–23)

## 2024-10-05 MED ORDER — ONDANSETRON HCL 4 MG/2ML IJ SOLN
INTRAMUSCULAR | Status: DC | PRN
  Administered 2024-10-05: 4 mg via INTRAVENOUS

## 2024-10-05 MED ORDER — SODIUM CHLORIDE 0.9 % IV SOLN: INTRAVENOUS | Status: DC

## 2024-10-05 MED ORDER — ONDANSETRON HCL 4 MG/2ML IJ SOLN: 4.0000 mg | Freq: Four times a day (QID) | INTRAMUSCULAR | Status: DC | PRN

## 2024-10-05 MED ORDER — HEPARIN SODIUM (PORCINE) 1000 UNIT/ML IJ SOLN
INTRAMUSCULAR | Status: DC | PRN
  Administered 2024-10-05: 5000 [IU] via INTRAVENOUS

## 2024-10-05 MED ORDER — LIDOCAINE-EPINEPHRINE (PF) 1 %-1:200000 IJ SOLN
INTRAMUSCULAR | Status: DC | PRN
  Administered 2024-10-05: 10 mL

## 2024-10-05 MED ORDER — OXYCODONE HCL 5 MG PO TABS
ORAL_TABLET | ORAL | Status: AC
  Filled 2024-10-05: qty 1

## 2024-10-05 MED ORDER — PHENYLEPHRINE HCL-NACL 20-0.9 MG/250ML-% IV SOLN
INTRAVENOUS | Status: DC | PRN
  Administered 2024-10-05: 50 ug/min via INTRAVENOUS

## 2024-10-05 MED ORDER — ACETAMINOPHEN 10 MG/ML IV SOLN
1000.0000 mg | Freq: Once | INTRAVENOUS | Status: DC | PRN
  Administered 2024-10-05: 1000 mg via INTRAVENOUS

## 2024-10-05 MED ORDER — PHENYLEPHRINE 80 MCG/ML (10ML) SYRINGE FOR IV PUSH (FOR BLOOD PRESSURE SUPPORT)
PREFILLED_SYRINGE | INTRAVENOUS | Status: DC | PRN
  Administered 2024-10-05 (×3): 240 ug via INTRAVENOUS

## 2024-10-05 MED ORDER — FENTANYL CITRATE (PF) 100 MCG/2ML IJ SOLN
INTRAMUSCULAR | Status: AC
  Filled 2024-10-05: qty 2

## 2024-10-05 MED ORDER — PHENYLEPHRINE 80 MCG/ML (10ML) SYRINGE FOR IV PUSH (FOR BLOOD PRESSURE SUPPORT)
PREFILLED_SYRINGE | INTRAVENOUS | Status: AC
  Filled 2024-10-05: qty 10

## 2024-10-05 MED ORDER — DEXMEDETOMIDINE HCL IN NACL 80 MCG/20ML IV SOLN
INTRAVENOUS | Status: AC
  Filled 2024-10-05: qty 20

## 2024-10-05 MED ORDER — METHYLPREDNISOLONE SODIUM SUCC 125 MG IJ SOLR: 125.0000 mg | Freq: Once | INTRAMUSCULAR | Status: DC | PRN

## 2024-10-05 MED ORDER — PROPOFOL 1000 MG/100ML IV EMUL
INTRAVENOUS | Status: AC
  Filled 2024-10-05: qty 100

## 2024-10-05 MED ORDER — TICAGRELOR 60 MG PO TABS: 60.0000 mg | ORAL_TABLET | Freq: Two times a day (BID) | ORAL | 3 refills | Status: AC

## 2024-10-05 MED ORDER — MIDAZOLAM HCL 2 MG/ML PO SYRP: 8.0000 mg | ORAL_SOLUTION | Freq: Once | ORAL | Status: DC | PRN

## 2024-10-05 MED ORDER — HYDROMORPHONE HCL 1 MG/ML IJ SOLN: 1.0000 mg | Freq: Once | INTRAMUSCULAR | Status: DC | PRN

## 2024-10-05 MED ORDER — PHENYLEPHRINE HCL-NACL 20-0.9 MG/250ML-% IV SOLN
INTRAVENOUS | Status: AC
  Filled 2024-10-05: qty 250

## 2024-10-05 MED ORDER — OXYCODONE HCL 5 MG/5ML PO SOLN: 5.0000 mg | Freq: Once | ORAL | Status: AC | PRN

## 2024-10-05 MED ORDER — PROPOFOL 500 MG/50ML IV EMUL
INTRAVENOUS | Status: DC | PRN
  Administered 2024-10-05 (×2): 20 mg via INTRAVENOUS
  Administered 2024-10-05: 100 ug/kg/min via INTRAVENOUS

## 2024-10-05 MED ORDER — SODIUM CHLORIDE 0.9% FLUSH: 3.0000 mL | INTRAVENOUS | Status: DC | PRN

## 2024-10-05 MED ORDER — SODIUM CHLORIDE 0.9 % IV SOLN: 250.0000 mL | INTRAVENOUS | Status: DC | PRN

## 2024-10-05 MED ORDER — LABETALOL HCL 5 MG/ML IV SOLN: 10.0000 mg | INTRAVENOUS | Status: DC | PRN

## 2024-10-05 MED ORDER — HYDRALAZINE HCL 20 MG/ML IJ SOLN: 5.0000 mg | INTRAMUSCULAR | Status: DC | PRN

## 2024-10-05 MED ORDER — LACTATED RINGERS IV SOLN: INTRAVENOUS | Status: DC

## 2024-10-05 MED ORDER — SODIUM CHLORIDE 0.9% FLUSH: 3.0000 mL | Freq: Two times a day (BID) | INTRAVENOUS | Status: DC

## 2024-10-05 MED ORDER — HEPARIN (PORCINE) IN NACL 1000-0.9 UT/500ML-% IV SOLN
INTRAVENOUS | Status: DC | PRN
  Administered 2024-10-05: 1000 mL

## 2024-10-05 MED ORDER — EPHEDRINE 5 MG/ML INJ
INTRAVENOUS | Status: AC
  Filled 2024-10-05: qty 5

## 2024-10-05 MED ORDER — CEFAZOLIN SODIUM-DEXTROSE 2-4 GM/100ML-% IV SOLN
2.0000 g | INTRAVENOUS | Status: AC
  Administered 2024-10-05: 2 g via INTRAVENOUS

## 2024-10-05 MED ORDER — DIPHENHYDRAMINE HCL 50 MG/ML IJ SOLN: 50.0000 mg | Freq: Once | INTRAMUSCULAR | Status: DC | PRN

## 2024-10-05 MED ORDER — ACETAMINOPHEN 10 MG/ML IV SOLN
INTRAVENOUS | Status: AC
  Filled 2024-10-05: qty 100

## 2024-10-05 MED ORDER — FAMOTIDINE 20 MG PO TABS: 40.0000 mg | ORAL_TABLET | Freq: Once | ORAL | Status: DC | PRN

## 2024-10-05 MED ORDER — ACETAMINOPHEN 325 MG PO TABS: 650.0000 mg | ORAL_TABLET | ORAL | Status: DC | PRN

## 2024-10-05 MED ORDER — OXYCODONE HCL 5 MG PO TABS
5.0000 mg | ORAL_TABLET | Freq: Once | ORAL | Status: AC | PRN
  Administered 2024-10-05: 5 mg via ORAL

## 2024-10-05 MED ORDER — EPHEDRINE SULFATE (PRESSORS) 25 MG/5ML IV SOSY
PREFILLED_SYRINGE | INTRAVENOUS | Status: DC | PRN
  Administered 2024-10-05: 15 mg via INTRAVENOUS

## 2024-10-05 MED ORDER — CEFAZOLIN SODIUM-DEXTROSE 2-4 GM/100ML-% IV SOLN
INTRAVENOUS | Status: AC
  Filled 2024-10-05: qty 100

## 2024-10-05 MED ORDER — DEXMEDETOMIDINE HCL IN NACL 80 MCG/20ML IV SOLN
INTRAVENOUS | Status: DC | PRN
  Administered 2024-10-05: 12 ug via INTRAVENOUS
  Administered 2024-10-05: 8 ug via INTRAVENOUS

## 2024-10-05 MED ORDER — FENTANYL CITRATE (PF) 100 MCG/2ML IJ SOLN
25.0000 ug | INTRAMUSCULAR | Status: DC | PRN
  Administered 2024-10-05 (×2): 25 ug via INTRAVENOUS
  Administered 2024-10-05 (×2): 50 ug via INTRAVENOUS

## 2024-10-05 MED ORDER — IODIXANOL 320 MG/ML IV SOLN
INTRAVENOUS | Status: DC | PRN
  Administered 2024-10-05: 60 mL

## 2024-10-05 MED ORDER — RIVAROXABAN 2.5 MG PO TABS: 2.5000 mg | ORAL_TABLET | Freq: Two times a day (BID) | ORAL | 3 refills | Status: AC

## 2024-10-05 MED ORDER — ONDANSETRON HCL 4 MG/2ML IJ SOLN
INTRAMUSCULAR | Status: AC
  Filled 2024-10-05: qty 2

## 2024-10-05 NOTE — Progress Notes (Signed)
 Pt. S.O. states pt. Has anemia and that when he was here in Sept. His Hemoglobin dropped a lot. Dr. Marea messaged. Orders received for STAT H&H. Labs drawn by RN and sent to lab (hand delivered by RN).

## 2024-10-05 NOTE — Transfer of Care (Signed)
 Immediate Anesthesia Transfer of Care Note  Patient: Dennis Fuller  Procedure(s) Performed: Lower Extremity Angiography (Right: Leg Lower)  Patient Location: PACU  Anesthesia Type:General  Level of Consciousness: awake, alert , and oriented  Airway & Oxygen Therapy: Patient Spontanous Breathing and Patient connected to nasal cannula oxygen  Post-op Assessment: Report given to RN and Post -op Vital signs reviewed and stable  Post vital signs: stable  Last Vitals:  Vitals Value Taken Time  BP 114/56 10/05/24 15:18  Temp    Pulse 87 10/05/24 15:26  Resp 16 10/05/24 15:26  SpO2 100 % 10/05/24 15:26  Vitals shown include unfiled device data.  Last Pain:  Vitals:   10/05/24 1250  TempSrc: Temporal         Complications: There were no known notable events for this encounter.

## 2024-10-05 NOTE — Interval H&P Note (Signed)
 History and Physical Interval Note:  10/05/2024 12:16 PM  Dennis Fuller  has presented today for surgery, with the diagnosis of RLE Angio    ASO w rest pain.  The various methods of treatment have been discussed with the patient and family. After consideration of risks, benefits and other options for treatment, the patient has consented to  Procedures: Lower Extremity Angiography (Right) as a surgical intervention.  The patient's history has been reviewed, patient examined, no change in status, stable for surgery.  I have reviewed the patient's chart and labs.  Questions were answered to the patient's satisfaction.     Raychelle Hudman

## 2024-10-05 NOTE — Progress Notes (Signed)
 STAT H&H resulted. Dr. Marea made aware. Type and screen ordered now. Vascular lab phoned and laboratory to draw blood.

## 2024-10-05 NOTE — Anesthesia Preprocedure Evaluation (Addendum)
"                                    Anesthesia Evaluation  Patient identified by MRN, date of birth, ID band Patient awake    Reviewed: Allergy & Precautions, NPO status , Patient's Chart, lab work & pertinent test results  History of Anesthesia Complications Negative for: history of anesthetic complications  Airway Mallampati: III   Neck ROM: Full    Dental  (+) Upper Dentures, Partial Lower, Missing   Pulmonary COPD, Current Smoker (12 cigarettes per day)Patient did not abstain from smoking.   Pulmonary exam normal breath sounds clear to auscultation       Cardiovascular hypertension, + Peripheral Vascular Disease (s/p LE stent on Plavix )  Normal cardiovascular exam Rhythm:Regular Rate:Normal     Neuro/Psych negative neurological ROS     GI/Hepatic negative GI ROS,,,  Endo/Other  negative endocrine ROS    Renal/GU negative Renal ROS     Musculoskeletal   Abdominal   Peds  Hematology  (+) Blood dyscrasia, anemia   Anesthesia Other Findings   Reproductive/Obstetrics                              Anesthesia Physical Anesthesia Plan  ASA: 3  Anesthesia Plan: General   Post-op Pain Management:    Induction: Intravenous  PONV Risk Score and Plan: 1 and Propofol  infusion, TIVA, Treatment may vary due to age or medical condition and Ondansetron   Airway Management Planned: Natural Airway  Additional Equipment:   Intra-op Plan:   Post-operative Plan:   Informed Consent: I have reviewed the patients History and Physical, chart, labs and discussed the procedure including the risks, benefits and alternatives for the proposed anesthesia with the patient or authorized representative who has indicated his/her understanding and acceptance.       Plan Discussed with: CRNA  Anesthesia Plan Comments: (LMA/GETA backup discussed.  Patient consented for risks of anesthesia including but not limited to:  - adverse reactions  to medications - damage to eyes, teeth, lips or other oral mucosa - nerve damage due to positioning  - sore throat or hoarseness - damage to heart, brain, nerves, lungs, other parts of body or loss of life  Informed patient about role of CRNA in peri- and intra-operative care.  Patient voiced understanding.)         Anesthesia Quick Evaluation  "

## 2024-10-05 NOTE — Progress Notes (Signed)
 Patient remains clinically stable post procedure. No bleeding nor hematoma at groin site. Denies complaints at this time. Vitals stable. Discharged at this time. Home.

## 2024-10-05 NOTE — Progress Notes (Addendum)
 Dr. Marea in at bedside, speaking with pt. And S.O. Alfrieda re: PCI of LE. Both verbalize understanding of conversation with MD. Dr. Marea told pt. To follow up with PCP reL low H&H. Pt. Asymptomatic with levels at present.

## 2024-10-05 NOTE — Anesthesia Procedure Notes (Signed)
 Procedure Name: LMA Insertion Date/Time: 10/05/2024 2:10 PM  Performed by: Governor Mayo B, CRNAPre-anesthesia Checklist: Patient identified, Emergency Drugs available, Suction available and Patient being monitored Patient Re-evaluated:Patient Re-evaluated prior to induction Oxygen Delivery Method: Circle system utilized Preoxygenation: Pre-oxygenation with 100% oxygen Induction Type: IV induction LMA: LMA inserted LMA Size: 4.0 Tube secured with: Tape Dental Injury: Teeth and Oropharynx as per pre-operative assessment

## 2024-10-05 NOTE — Op Note (Addendum)
 [facility]  Percutaneous Study/Intervention Procedural Note   Date of Surgery: 10/05/2024  Surgeon(s):Merl Guardino    Assistants:none  Pre-operative Diagnosis: PAD with rest pain right lower extremity  Post-operative diagnosis:  Same  Procedure(s) Performed:             1.  Ultrasound guidance for vascular access left femoral artery             2.  Catheter placement into right SFA from left femoral approach             3.  Aortogram and selective right lower extremity angiogram             4.  Percutaneous transluminal angioplasty of the infra malleolar portion of the right posterior tibial artery with a 2.5 mm diameter by 15 cm length angioplasty balloon             5.  Percutaneous transluminal angioplasty of the tibioperoneal trunk and most proximal posterior tibial artery with 3 mm diameter by 22 cm length angioplasty balloon  6.  Stent placement to the proximal posterior tibial artery with 3 mm diameter by 38 mm length Esprit scaffolding system             7.  StarClose closure device left femoral artery  EBL: 5 cc  Contrast: 60 cc  Fluoro Time: 6.8 minutes  Anesthesia: general              Indications:  Patient is a 79 y.o.male with rest pain and a history of severe and profound ischemia with revascularization previously. The patient has noninvasive study showing flat digital pressures on the right. The patient is brought in for angiography for further evaluation and potential treatment.  Due to the limb threatening nature of the situation, angiogram was performed for attempted limb salvage. The patient is aware that if the procedure fails, amputation would be expected.  The patient also understands that even with successful revascularization, amputation may still be required due to the severity of the situation.  Risks and benefits are discussed and informed consent is obtained.   Procedure:  The patient was identified and appropriate procedural time out  was performed.  The patient was then placed supine on the table and prepped and draped in the usual sterile fashion. Anesthesia staff provided sedation. Ultrasound was used to evaluate the left common femoral artery.  It was heavily diseased.  A digital ultrasound image was acquired.  A Seldinger needle was used to access the left common femoral artery under direct ultrasound guidance and a permanent image was performed.  A 0.035 J wire was advanced without resistance and a 5Fr sheath was placed.  Pigtail catheter was placed into the aorta and an AP aortogram was performed. This demonstrated 2 renal arteries bilaterally.  The aorta was calcific and mildly ectatic but not stenotic.  The left common iliac artery was irregular with borderline 50% stenosis and marked tortuosity.  The right common iliac artery also had mild stenosis but this appeared to be less than 50%.  The right external iliac artery was fairly normal. I then crossed the aortic bifurcation and advanced to the right femoral head. Selective right lower extremity angiogram was then performed. This demonstrated severe calcific disease of an ectatic right common femoral artery creating mild stenosis of less than 50%.  The SFA and popliteal stents were patent.  The profunda femoris artery is very small and diseased.  There was a typical tibial trifurcation with  severe tibial level disease.  The anterior tibial artery had a high-grade stenosis proximally and then occluded above the ankle without good distal reconstitution.  The tibioperoneal trunk and the proximal portion of the posterior tibial artery and peroneal artery had a high-grade stenosis of greater than 85%.  The posterior tibial artery then normalized and was the dominant runoff vessel but it occluded at the level of the ankle and was occluded in the infra malleolar portion although there was distal reconstitution in the midfoot. It was felt that it was in the patient's best interest to proceed  with intervention after these images to avoid a second procedure and a larger amount of contrast and fluoroscopy based off of the findings from the initial angiogram. The patient was systemically heparinized and a 6 French Ansell sheath was then placed over the [employer] wire. I then used a Kumpe catheter and the advantage wire to navigate down through the SFA and popliteal stents and get into the tibioperoneal trunk where I exchanged for a CXI catheter and a command 14 wire.  I crossed the stenosis in the tibioperoneal trunk and proximal posterior tibial artery and then navigated down to the distal posterior tibial artery and cross the occlusion in the infra malleolar portion of the posterior tibial artery and got a wire all the way out to the distal foot.  I then performed angioplasty of the infra malleolar portion of the posterior tibial artery for occlusion with a 2.5 mm diameter by 15 cm length angioplasty balloon inflated to 8 atm for 1 minute.  Completion imaging now showed flow through this vessel although there was some residual stenosis and some spasm from the treatment of the small vessel.  I then turned my attention to the tibioperoneal trunk and proximal posterior tibial artery.  A 3 mm diameter by 22 cm length angioplasty balloon was inflated to 10 atm for 1 minute.  Completion imaging showed 1 area in the proximal posterior tibial artery that still had significant residual disease of greater than 50% in this area was treated with a 3 mm diameter by 38 mm length Esprit stent.  This was dilated to 12 atm and then postdilated with a 3 mm balloon with excellent angiograph completion result and less than 20% residual stenosis. I elected to terminate the procedure. The sheath was removed and StarClose closure device was deployed in the left femoral artery with excellent hemostatic result. The patient was taken to the recovery room in stable condition having tolerated the procedure well.  Findings:                Aortogram:  This demonstrated 2 renal arteries bilaterally.  The aorta was calcific and mildly ectatic but not stenotic.  The left common iliac artery was irregular with borderline 50% stenosis and marked tortuosity.  The right common iliac artery also had mild stenosis but this appeared to be less than 50%.  The right external iliac artery was fairly normal             Right Lower Extremity:  This demonstrated severe calcific disease of an ectatic right common femoral artery creating mild stenosis of less than 50%.  The SFA and popliteal stents were patent.  The profunda femoris artery is very small and diseased.  There was a typical tibial trifurcation with severe tibial level disease.  The anterior tibial artery had a high-grade stenosis proximally and then occluded above the ankle without good distal reconstitution.  The tibioperoneal trunk and  the proximal portion of the posterior tibial artery and peroneal artery had a high-grade stenosis of greater than 85%.  The posterior tibial artery then normalized and was the dominant runoff vessel but it occluded at the level of the ankle and was occluded in the infra malleolar portion although there was distal reconstitution in the midfoot.    Disposition: Patient was taken to the recovery room in stable condition having tolerated the procedure well.  Complications: None  Selinda Gu 10/05/2024 3:13 PM   This note was created with Dragon Medical transcription system. Any errors in dictation are purely unintentional.

## 2024-10-06 ENCOUNTER — Encounter: Payer: Self-pay | Admitting: Vascular Surgery

## 2024-10-06 NOTE — Anesthesia Postprocedure Evaluation (Signed)
"   Anesthesia Post Note  Patient: Dennis Fuller  Procedure(s) Performed: Lower Extremity Angiography (Right: Leg Lower)  Patient location during evaluation: PACU Anesthesia Type: General Level of consciousness: awake and alert, oriented and patient cooperative Pain management: pain level controlled Vital Signs Assessment: post-procedure vital signs reviewed and stable Respiratory status: spontaneous breathing, nonlabored ventilation and respiratory function stable Cardiovascular status: blood pressure returned to baseline and stable Postop Assessment: adequate PO intake Anesthetic complications: no   There were no known notable events for this encounter.   Last Vitals:  Vitals:   10/05/24 1640 10/05/24 1645  BP: (!) 110/58   Pulse: 84 84  Resp: (!) 24 19  Temp: 36.4 C   SpO2: 100% (!) 88%    Last Pain:  Vitals:   10/05/24 1659  TempSrc:   PainSc: 0-No pain                 Alfonso Ruths      "

## 2024-10-10 ENCOUNTER — Encounter: Payer: Self-pay | Admitting: Vascular Surgery

## 2024-10-12 ENCOUNTER — Encounter: Admitting: Physician Assistant

## 2024-10-12 ENCOUNTER — Encounter (INDEPENDENT_AMBULATORY_CARE_PROVIDER_SITE_OTHER): Payer: Self-pay | Admitting: Vascular Surgery

## 2024-10-13 ENCOUNTER — Encounter (INDEPENDENT_AMBULATORY_CARE_PROVIDER_SITE_OTHER): Payer: Self-pay | Admitting: Vascular Surgery

## 2024-10-13 ENCOUNTER — Other Ambulatory Visit (INDEPENDENT_AMBULATORY_CARE_PROVIDER_SITE_OTHER): Payer: Self-pay | Admitting: Nurse Practitioner

## 2024-10-13 MED ORDER — OXYCODONE-ACETAMINOPHEN 5-325 MG PO TABS: 1.0000 | ORAL_TABLET | Freq: Two times a day (BID) | ORAL | 0 refills | Status: AC | PRN

## 2024-10-19 ENCOUNTER — Encounter: Admitting: Physician Assistant

## 2024-10-26 ENCOUNTER — Ambulatory Visit (INDEPENDENT_AMBULATORY_CARE_PROVIDER_SITE_OTHER): Admitting: Nurse Practitioner

## 2024-10-26 ENCOUNTER — Encounter (INDEPENDENT_AMBULATORY_CARE_PROVIDER_SITE_OTHER)
